# Patient Record
Sex: Male | Born: 1945 | Race: White | Hispanic: No | Marital: Married | Smoking: Former smoker
Health system: Southern US, Community
[De-identification: ages and names within clinical notes are randomized; demographics above are authoritative.]

## PROBLEM LIST (undated history)

## (undated) DIAGNOSIS — E785 Hyperlipidemia, unspecified: Secondary | ICD-10-CM

## (undated) DIAGNOSIS — M1990 Unspecified osteoarthritis, unspecified site: Secondary | ICD-10-CM

## (undated) DIAGNOSIS — N200 Calculus of kidney: Secondary | ICD-10-CM

## (undated) DIAGNOSIS — I2510 Atherosclerotic heart disease of native coronary artery without angina pectoris: Secondary | ICD-10-CM

## (undated) HISTORY — PX: OTHER SURGICAL HISTORY: SHX169

## (undated) HISTORY — PX: SHOULDER SURGERY: SHX246

## (undated) HISTORY — PX: BACK SURGERY: SHX140

## (undated) HISTORY — PX: FOOT SURGERY: SHX648

## (undated) HISTORY — PX: EYE SURGERY: SHX253

## (undated) HISTORY — DX: Calculus of kidney: N20.0

## (undated) HISTORY — DX: Hyperlipidemia, unspecified: E78.5

---

## 2003-03-16 ENCOUNTER — Encounter: Admission: RE | Admit: 2003-03-16 | Discharge: 2003-03-16 | Payer: Self-pay | Admitting: Orthopedic Surgery

## 2003-04-10 ENCOUNTER — Encounter: Admission: RE | Admit: 2003-04-10 | Discharge: 2003-04-10 | Payer: Self-pay | Admitting: Neurosurgery

## 2003-04-21 ENCOUNTER — Encounter: Admission: RE | Admit: 2003-04-21 | Discharge: 2003-04-21 | Payer: Self-pay | Admitting: Neurosurgery

## 2003-06-01 ENCOUNTER — Ambulatory Visit (HOSPITAL_COMMUNITY): Admission: RE | Admit: 2003-06-01 | Discharge: 2003-06-01 | Payer: Self-pay | Admitting: Neurosurgery

## 2003-08-07 ENCOUNTER — Encounter: Admission: RE | Admit: 2003-08-07 | Discharge: 2003-08-07 | Payer: Self-pay | Admitting: Orthopedic Surgery

## 2007-11-18 ENCOUNTER — Encounter: Admission: RE | Admit: 2007-11-18 | Discharge: 2007-11-18 | Payer: Self-pay | Admitting: Sports Medicine

## 2010-03-28 ENCOUNTER — Encounter: Payer: Self-pay | Admitting: Sports Medicine

## 2010-07-22 NOTE — Op Note (Signed)
NAME:  Robert Petty, Robert Petty                        ACCOUNT NO.:  [id number]   MEDICAL RECORD NO.:  [id number]                   PATIENT TYPE:  OIB   LOCATION:  2899                                 FACILITY:  [facility]   PHYSICIAN:  Reinaldo Meeker, M.D.              DATE OF BIRTH:  18-May-1945   DATE OF PROCEDURE:  06/01/2003  DATE OF DISCHARGE:                                 OPERATIVE REPORT   PREOPERATIVE DIAGNOSIS:  Herniated disk, L4-5 left, far lateral.   POSTOPERATIVE DIAGNOSIS:  Herniated disk, L4-5 left, far lateral.   OPERATION PERFORMED:  Left L4-5 extraforaminal diskectomy.   SECONDARY PROCEDURE:  Microdissection L4 nerve root and L4-5 disk.   SURGEON:  Reinaldo Meeker, M.D.   ASSISTANT:  Tia Alert, MD   ANESTHESIA:  General.   DESCRIPTION OF PROCEDURE:  After being placed in the prone position, the  patient's back was shaved, prepped and draped in the usual sterile fashion.  Localizing x-ray was taken prior to incision to identify the appropriate  level.  Midline incision was made over the spinous processes of L4 and L5.  Using Bovie cutting current, the incision was carried down to the spinous  processes.  Subperiosteal dissection was then carried out along the left  side of the spinous processes and lamina, facet joint into the far lateral  region where the transverse process of L4 was identified.  A second x-ray  showed approach to the appropriate level.  Using a high speed drill the  inferior edge of the transverse process, the lateral edge of the facet joint  were thinned and then the thinned edges removed with a Kerrison punch.  The  intertransverse ligament was incised and the cut edge removed with a  Kerrison punch.  Soft tissue in the far lateral region was separated and the  L4 nerve root was easily identified.  The microscope was draped, brought  into the field and used for the remainder of the case.  Using  microdissection technique the area inferior to  the L4 nerve root was  identified.  Disk herniation was noted in that area and the disk was  incised.  Numerous herniated disk fragments were removed from beneath the  nerve root and this gave excellent decompression of the L4 nerve root.  At  this point inspection was carried out in all directions for any evidence of  residual compression and none could be identified.  Large amounts of  irrigation were carried out.  Any bleeding was controlled with bipolar  coagulation and Gelfoam. The wound was then closed using __________ Vicryl  on the muscle and fascia and subcutaneous and subcuticular tissues and  staples on the skin.  A sterile dressing was then applied and the patient  was extubated and taken to the recovery room in stable condition.  Reinaldo Meeker, M.D.    ROK/MEDQ  D:  06/01/2003  T:  06/01/2003  Job:  [id number]

## 2010-11-16 ENCOUNTER — Ambulatory Visit
Admission: RE | Admit: 2010-11-16 | Discharge: 2010-11-16 | Disposition: A | Discharge status: Home / Self Care | Payer: PRIVATE HEALTH INSURANCE | Source: Ambulatory Visit | Attending: Podiatry | Admitting: Podiatry

## 2010-11-16 ENCOUNTER — Other Ambulatory Visit: Payer: Self-pay | Admitting: Podiatry

## 2010-11-16 DIAGNOSIS — M79672 Pain in left foot: Secondary | ICD-10-CM

## 2015-08-27 ENCOUNTER — Telehealth (HOSPITAL_COMMUNITY): Payer: Self-pay | Admitting: *Deleted

## 2015-08-27 NOTE — Telephone Encounter (Signed)
Left message for patient to call back to get nuclear stress test scheduled.

## 2015-08-30 ENCOUNTER — Other Ambulatory Visit (HOSPITAL_COMMUNITY): Payer: Self-pay | Admitting: Internal Medicine

## 2015-08-30 DIAGNOSIS — R079 Chest pain, unspecified: Secondary | ICD-10-CM

## 2015-09-09 ENCOUNTER — Telehealth (HOSPITAL_COMMUNITY): Payer: Self-pay | Admitting: *Deleted

## 2015-09-09 NOTE — Telephone Encounter (Signed)
Left message on voicemail in reference to upcoming appointment scheduled for 09/14/15. Phone number given for a call back so details instructions can be given. Rodolfo Gaster Jacqueline 

## 2015-09-13 ENCOUNTER — Telehealth (HOSPITAL_COMMUNITY): Payer: Self-pay

## 2015-09-13 NOTE — Telephone Encounter (Signed)
Patient given detailed instructions per Myocardial Perfusion Study Information Sheet for the test on 09/14/2015 at 7:15. Patient notified to arrive 15 minutes early and that it is imperative to arrive on time for appointment to keep from having the test rescheduled.  If you need to cancel or reschedule your appointment, please call the office within 24 hours of your appointment. Failure to do so may result in a cancellation of your appointment, and a $50 no show fee. Patient verbalized understanding.EHK

## 2015-09-14 ENCOUNTER — Ambulatory Visit (HOSPITAL_COMMUNITY): Payer: Medicare Other | Attending: Cardiology

## 2015-09-14 ENCOUNTER — Telehealth: Payer: Self-pay | Admitting: Cardiovascular Disease

## 2015-09-14 DIAGNOSIS — R9439 Abnormal result of other cardiovascular function study: Secondary | ICD-10-CM | POA: Diagnosis not present

## 2015-09-14 DIAGNOSIS — I517 Cardiomegaly: Secondary | ICD-10-CM | POA: Diagnosis not present

## 2015-09-14 DIAGNOSIS — R079 Chest pain, unspecified: Secondary | ICD-10-CM | POA: Insufficient documentation

## 2015-09-14 DIAGNOSIS — I452 Bifascicular block: Secondary | ICD-10-CM | POA: Insufficient documentation

## 2015-09-14 LAB — MYOCARDIAL PERFUSION IMAGING
CHL CUP NUCLEAR SDS: 3
CHL CUP NUCLEAR SRS: 1
CHL CUP RESTING HR STRESS: 51 {beats}/min
LHR: 0.18
LV dias vol: 169 mL (ref 62–150)
LV sys vol: 98 mL
Peak HR: 88 {beats}/min
SSS: 4
TID: 1.17

## 2015-09-14 MED ORDER — TECHNETIUM TC 99M TETROFOSMIN IV KIT
33.0000 | PACK | Freq: Once | INTRAVENOUS | Status: AC | PRN
  Administered 2015-09-14: 33 via INTRAVENOUS
  Filled 2015-09-14: qty 33

## 2015-09-14 MED ORDER — TECHNETIUM TC 99M TETROFOSMIN IV KIT
10.2000 | PACK | Freq: Once | INTRAVENOUS | Status: AC | PRN
  Administered 2015-09-14: 10 via INTRAVENOUS
  Filled 2015-09-14: qty 10

## 2015-09-14 MED ORDER — REGADENOSON 0.4 MG/5ML IV SOLN
0.4000 mg | Freq: Once | INTRAVENOUS | Status: AC
  Administered 2015-09-14: 0.4 mg via INTRAVENOUS

## 2015-09-14 NOTE — Telephone Encounter (Signed)
I would be happy to see him.  Will discuss with Marcelino DusterMichelle and see where where we can work him into the schedule

## 2015-09-14 NOTE — Telephone Encounter (Signed)
Will forward to Dr Nahser for review  

## 2015-09-14 NOTE — Telephone Encounter (Signed)
New message      Pt had stress test this am.  Dr Wylene Simmerisovec want to know if we are going to follow up with an ov or some other test based on the stress test results.  Please call

## 2015-09-15 ENCOUNTER — Ambulatory Visit (INDEPENDENT_AMBULATORY_CARE_PROVIDER_SITE_OTHER): Payer: Medicare Other | Admitting: Cardiovascular Disease

## 2015-09-15 ENCOUNTER — Encounter: Payer: Self-pay | Admitting: Cardiovascular Disease

## 2015-09-15 ENCOUNTER — Encounter: Payer: Self-pay | Admitting: Nurse Practitioner

## 2015-09-15 VITALS — BP 90/70 | HR 61 | Ht 70.5 in | Wt 232.8 lb

## 2015-09-15 DIAGNOSIS — R079 Chest pain, unspecified: Secondary | ICD-10-CM | POA: Diagnosis not present

## 2015-09-15 DIAGNOSIS — R9439 Abnormal result of other cardiovascular function study: Secondary | ICD-10-CM | POA: Diagnosis not present

## 2015-09-15 DIAGNOSIS — E785 Hyperlipidemia, unspecified: Secondary | ICD-10-CM | POA: Diagnosis not present

## 2015-09-15 LAB — CBC WITH DIFFERENTIAL/PLATELET
BASOS ABS: 0 {cells}/uL (ref 0–200)
BASOS PCT: 0 %
EOS PCT: 4 %
Eosinophils Absolute: 320 cells/uL (ref 15–500)
HCT: 42.6 % (ref 38.5–50.0)
HEMOGLOBIN: 14.4 g/dL (ref 13.2–17.1)
LYMPHS ABS: 1680 {cells}/uL (ref 850–3900)
Lymphocytes Relative: 21 %
MCH: 30.4 pg (ref 27.0–33.0)
MCHC: 33.8 g/dL (ref 32.0–36.0)
MCV: 89.9 fL (ref 80.0–100.0)
MONOS PCT: 9 %
MPV: 11.6 fL (ref 7.5–12.5)
Monocytes Absolute: 720 cells/uL (ref 200–950)
NEUTROS ABS: 5280 {cells}/uL (ref 1500–7800)
Neutrophils Relative %: 66 %
PLATELETS: 243 10*3/uL (ref 140–400)
RBC: 4.74 MIL/uL (ref 4.20–5.80)
RDW: 14 % (ref 11.0–15.0)
WBC: 8 10*3/uL (ref 3.8–10.8)

## 2015-09-15 MED ORDER — ATORVASTATIN CALCIUM 40 MG PO TABS: 40.0000 mg | ORAL_TABLET | Freq: Every day | ORAL | Status: DC

## 2015-09-15 MED ORDER — ASPIRIN EC 81 MG PO TBEC: 81.0000 mg | DELAYED_RELEASE_TABLET | Freq: Every day | ORAL | Status: DC

## 2015-09-15 NOTE — Telephone Encounter (Signed)
Left message for patient to call regarding setting up an appointment

## 2015-09-15 NOTE — Patient Instructions (Addendum)
Medication Instructions:  STOP Simvastatin START Atorvastatin 40 mg once daily  START Aspirin 81 mg once daily   Labwork: TODAY - Pt/INR, CBC, basic metabolic panel, cholesterol   Testing/Procedures: Your physician has requested that you have a cardiac catheterization. Cardiac catheterization is used to diagnose and/or treat various heart conditions. Doctors may recommend this procedure for a number of different reasons. The most common reason is to evaluate chest pain. Chest pain can be a symptom of coronary artery disease (CAD), and cardiac catheterization can show whether plaque is narrowing or blocking your heart's arteries. This procedure is also used to evaluate the valves, as well as measure the blood flow and oxygen levels in different parts of your heart. For further information please visit [email or web address] Please follow instruction sheet, as given.   Follow-Up: Your physician recommends that you return for a follow-up appointment on: September 14 @ 8:45 am   If you need a refill on your cardiac medications before your next appointment, please call your pharmacy.   Thank you for choosing [facility]! Eligha BridegroomMichelle Swinyer, RN [phone]

## 2015-09-15 NOTE — Progress Notes (Signed)
 Cardiology Office Note   Date:  09/15/2015   ID:  Robert Petty, DOB 07/04/1945, MRN [id number]  PCP:  Richard W Tisovec, MD  Cardiologist:   Philip Nahser, MD   Chief Complaint  Patient presents with  . Chest Pain   Problem list 1. Essential hypertension 2. Hyperlipidemia   History of Present Illness: Robert Petty is a 70 y.o. male who presents at the request of Dr. Tisovec.    He has had some "indigestion like CP " Has some shortness of breath with exertion . Used to work out at the gym Has been progressing over the past several months  No syncope Some dyspnea.   Symptoms better with rest  Not associated with eating or drinking  Or twisting torso   Has seen Dr. Tennant in the past.  Has had a cath in the past.  Had minimal irreg.   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      Past Medical History  Diagnosis Date  . Hyperlipidemia   . Kidney stones     Past Surgical History  Procedure Laterality Date  . Back surgery    . Herniated disc repair    . Shoulder surgery       Current Outpatient Prescriptions  Medication Sig Dispense Refill  . aspirin EC 81 MG tablet Take 1 tablet (81 mg total) by mouth daily.    . atorvastatin (LIPITOR) 40 MG tablet Take 1 tablet (40 mg total) by mouth daily. 30 tablet 11   No current facility-administered medications for this visit.    Allergies:   Review of patient's allergies indicates no known allergies.    Social History:  The patient  reports that he has quit smoking. He does not have any smokeless tobacco history on file. He reports that he drinks alcohol. He reports that he does not use illicit drugs.   Family History:  The patient's family history includes Alzheimer's disease in his mother; Brain cancer in his brother; Cancer in his paternal grandfather; Diabetes in his brother, brother, brother, brother,  sister, sister, and sister; Heart disease in his brother; Hyperlipidemia in his brother, brother, brother, brother, mother, sister, sister, and sister; Hypertension in his brother, mother, sister, sister, and sister; Leukemia in his sister; Lung cancer in his brother; Prostate cancer in his father.    ROS:  Please see the history of present illness.    Review of Systems: Constitutional:  denies fever, chills, diaphoresis, appetite change and fatigue.  HEENT: denies photophobia, eye pain, redness, hearing loss, ear pain, congestion, sore throat, rhinorrhea, sneezing, neck pain, neck stiffness and tinnitus.  Respiratory: denies SOB, DOE, cough, chest tightness, and wheezing.  Cardiovascular: admits to chest pain, palpitations and leg swelling.  Gastrointestinal: denies nausea, vomiting, abdominal pain, diarrhea, constipation, blood in stool.  Genitourinary: denies dysuria, urgency, frequency, hematuria, flank pain and difficulty urinating.  Musculoskeletal: denies  myalgias, back pain, joint swelling, arthralgias and gait problem.   Skin: denies pallor, rash and wound.  Neurological: denies dizziness, seizures, syncope, weakness, light-headedness, numbness and headaches.   Hematological: denies adenopathy, easy bruising, personal or family bleeding history.  Psychiatric/ Behavioral: denies suicidal ideation, mood changes, confusion, nervousness, sleep disturbance and agitation.       All other systems are reviewed and negative.    PHYSICAL EXAM: VS:  BP 90/70 mmHg  Pulse 61    Ht 5' 10.5" (1.791 m)  Wt 232 lb 12.8 oz (105.597 kg)  BMI 32.92 kg/m2 , BMI Body mass index is 32.92 kg/(m^2). GEN: Well nourished, well developed, in no acute distress HEENT: normal Neck: no JVD, carotid bruits, or masses Cardiac: \RRR; no murmurs, rubs, or gallops,no edema  Respiratory:  clear to auscultation bilaterally, normal work of breathing GI: soft, nontender, nondistended, + BS MS: no deformity or  atrophy Skin: warm and dry, no rash Neuro:  Strength and sensation are intact Psych: normal   EKG:  EKG is ordered today. The ekg ordered today demonstrates  NSR at 61.  LAHB.  Isolated TWI in III ( not significant )    Recent Labs: No results found for requested labs within last 365 days.    Lipid Panel No results found for: CHOL, TRIG, HDL, CHOLHDL, VLDL, LDLCALC, LDLDIRECT    Wt Readings from Last 3 Encounters:  09/15/15 232 lb 12.8 oz (105.597 kg)      Other studies Reviewed: Additional studies/ records that were reviewed today include: . Review of the above records demonstrates:    ASSESSMENT AND PLAN:  1.  Chest pain : Robert Petty presents today following an abnormal Myoview study. 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 His ejection fraction is mildly to moderately reduced with an EF of 42%.  He's had a cardiac cath in the past with Dr. Deborah Chalkennant. He was found have moderate irregularities at that time. He's been on simvastatin for many years.  We will schedule him for a cardiac cath on Wednesday . We'll start him on aspirin. We'll stop the simvastatin and put him on atorvastatin 40 mg a day. We discussed the risks, benefits, and options concerning heart cath.   He understands and agrees to proceed.  2. Hyperlipidemia:  We will discontinue the simvastatin and start him on atorvastatin 40 mg a day. He may need to be changed to crestor if he cannot reach an LDL of 70    Current medicines are reviewed at length with the patient today.  The patient does not have concerns regarding medicines.  Labs/ tests ordered today include:   Orders Placed This Encounter  Procedures  . INR/PT  . Basic Metabolic Panel (BMET)  . CBC w/Diff  . EKG 12-Lead     Disposition:   FU with me in 2-3 months      Kristeen MissPhilip Nahser, MD  09/15/2015 3:54 PM    [facility] [address], [place], [place]  [address] Phone: [phone]; Fax:  [phone]   [facility]  [address] [place], [place]  [address] [phone]   Fax [phone]

## 2015-09-15 NOTE — Telephone Encounter (Signed)
Spoke with patient who is scheduled to see Dr. Elease HashimotoNahser today at 3:15

## 2015-09-16 LAB — BASIC METABOLIC PANEL
BUN: 20 mg/dL (ref 7–25)
CALCIUM: 9.5 mg/dL (ref 8.6–10.3)
CHLORIDE: 108 mmol/L (ref 98–110)
CO2: 24 mmol/L (ref 20–31)
Creat: 0.98 mg/dL (ref 0.70–1.18)
Glucose, Bld: 82 mg/dL (ref 65–99)
Potassium: 4.3 mmol/L (ref 3.5–5.3)
SODIUM: 141 mmol/L (ref 135–146)

## 2015-09-16 LAB — LIPID PANEL
CHOL/HDL RATIO: 3.3 ratio (ref <=5.0)
CHOLESTEROL: 134 mg/dL (ref 125–200)
HDL: 41 mg/dL (ref >=40)
LDL Cholesterol: 71 mg/dL (ref <130)
TRIGLYCERIDES: 112 mg/dL (ref <150)
VLDL: 22 mg/dL (ref <30)

## 2015-09-16 LAB — PROTIME-INR
INR: 1
PROTHROMBIN TIME: 11 s (ref 9.0–11.5)

## 2015-09-22 ENCOUNTER — Encounter (HOSPITAL_COMMUNITY)
Admission: RE | Disposition: A | Discharge status: Home / Self Care | Payer: Self-pay | Source: Ambulatory Visit | Attending: Cardiovascular Disease

## 2015-09-22 ENCOUNTER — Ambulatory Visit (HOSPITAL_COMMUNITY)
Admission: RE | Admit: 2015-09-22 | Discharge: 2015-09-22 | Disposition: A | Discharge status: Home / Self Care | Payer: Medicare Other | Source: Ambulatory Visit | Attending: Cardiovascular Disease | Admitting: Cardiovascular Disease

## 2015-09-22 ENCOUNTER — Encounter (HOSPITAL_COMMUNITY): Payer: Self-pay | Admitting: Cardiovascular Disease

## 2015-09-22 DIAGNOSIS — I10 Essential (primary) hypertension: Secondary | ICD-10-CM | POA: Insufficient documentation

## 2015-09-22 DIAGNOSIS — E785 Hyperlipidemia, unspecified: Secondary | ICD-10-CM | POA: Diagnosis not present

## 2015-09-22 DIAGNOSIS — Z808 Family history of malignant neoplasm of other organs or systems: Secondary | ICD-10-CM | POA: Insufficient documentation

## 2015-09-22 DIAGNOSIS — Z87891 Personal history of nicotine dependence: Secondary | ICD-10-CM | POA: Diagnosis not present

## 2015-09-22 DIAGNOSIS — Z833 Family history of diabetes mellitus: Secondary | ICD-10-CM | POA: Diagnosis not present

## 2015-09-22 DIAGNOSIS — Z7982 Long term (current) use of aspirin: Secondary | ICD-10-CM | POA: Insufficient documentation

## 2015-09-22 DIAGNOSIS — Z8042 Family history of malignant neoplasm of prostate: Secondary | ICD-10-CM | POA: Diagnosis not present

## 2015-09-22 DIAGNOSIS — R9439 Abnormal result of other cardiovascular function study: Secondary | ICD-10-CM | POA: Diagnosis present

## 2015-09-22 DIAGNOSIS — Z801 Family history of malignant neoplasm of trachea, bronchus and lung: Secondary | ICD-10-CM | POA: Insufficient documentation

## 2015-09-22 DIAGNOSIS — Z87442 Personal history of urinary calculi: Secondary | ICD-10-CM | POA: Diagnosis not present

## 2015-09-22 DIAGNOSIS — I2510 Atherosclerotic heart disease of native coronary artery without angina pectoris: Secondary | ICD-10-CM | POA: Diagnosis not present

## 2015-09-22 DIAGNOSIS — Z8249 Family history of ischemic heart disease and other diseases of the circulatory system: Secondary | ICD-10-CM | POA: Diagnosis not present

## 2015-09-22 HISTORY — PX: CARDIAC CATHETERIZATION: SHX172

## 2015-09-22 SURGERY — LEFT HEART CATH AND CORONARY ANGIOGRAPHY
Anesthesia: LOCAL

## 2015-09-22 MED ORDER — SODIUM CHLORIDE 0.9% FLUSH: 3.0000 mL | INTRAVENOUS | Status: DC | PRN

## 2015-09-22 MED ORDER — HEPARIN (PORCINE) IN NACL 2-0.9 UNIT/ML-% IJ SOLN
INTRAMUSCULAR | Status: AC
  Filled 2015-09-22: qty 1000

## 2015-09-22 MED ORDER — ACETAMINOPHEN 325 MG PO TABS: 650.0000 mg | ORAL_TABLET | ORAL | Status: DC | PRN

## 2015-09-22 MED ORDER — IOPAMIDOL (ISOVUE-370) INJECTION 76%
INTRAVENOUS | Status: AC
  Filled 2015-09-22: qty 100

## 2015-09-22 MED ORDER — IOPAMIDOL (ISOVUE-370) INJECTION 76%
INTRAVENOUS | Status: DC | PRN
  Administered 2015-09-22: 85 mL

## 2015-09-22 MED ORDER — FENTANYL CITRATE (PF) 100 MCG/2ML IJ SOLN
INTRAMUSCULAR | Status: AC
  Filled 2015-09-22: qty 2

## 2015-09-22 MED ORDER — SODIUM CHLORIDE 0.9 % IV SOLN: 250.0000 mL | INTRAVENOUS | Status: DC | PRN

## 2015-09-22 MED ORDER — MIDAZOLAM HCL 2 MG/2ML IJ SOLN
INTRAMUSCULAR | Status: DC | PRN
  Administered 2015-09-22: 2 mg via INTRAVENOUS

## 2015-09-22 MED ORDER — VERAPAMIL HCL 2.5 MG/ML IV SOLN
INTRAVENOUS | Status: AC
  Filled 2015-09-22: qty 2

## 2015-09-22 MED ORDER — SODIUM CHLORIDE 0.9 % WEIGHT BASED INFUSION
3.0000 mL/kg/h | INTRAVENOUS | Status: AC
  Administered 2015-09-22: 3 mL/kg/h via INTRAVENOUS

## 2015-09-22 MED ORDER — ASPIRIN 81 MG PO CHEW
CHEWABLE_TABLET | ORAL | Status: AC
Start: 2015-09-22 — End: 2015-09-22
  Administered 2015-09-22: 81 mg via ORAL
  Filled 2015-09-22: qty 1

## 2015-09-22 MED ORDER — SODIUM CHLORIDE 0.9 % IV SOLN
250.0000 mL | INTRAVENOUS | Status: DC | PRN
Start: 2015-09-22 — End: 2015-09-22

## 2015-09-22 MED ORDER — SODIUM CHLORIDE 0.9% FLUSH: 3.0000 mL | Freq: Two times a day (BID) | INTRAVENOUS | Status: DC

## 2015-09-22 MED ORDER — SODIUM CHLORIDE 0.9 % IV SOLN
INTRAVENOUS | Status: DC
Start: 2015-09-22 — End: 2015-09-22

## 2015-09-22 MED ORDER — VERAPAMIL HCL 2.5 MG/ML IV SOLN
INTRAVENOUS | Status: DC | PRN
  Administered 2015-09-22: 10 mL via INTRA_ARTERIAL

## 2015-09-22 MED ORDER — HEPARIN SODIUM (PORCINE) 1000 UNIT/ML IJ SOLN
INTRAMUSCULAR | Status: DC | PRN
  Administered 2015-09-22: 5000 [IU] via INTRAVENOUS

## 2015-09-22 MED ORDER — MIDAZOLAM HCL 2 MG/2ML IJ SOLN
INTRAMUSCULAR | Status: AC
  Filled 2015-09-22: qty 2

## 2015-09-22 MED ORDER — HEPARIN SODIUM (PORCINE) 1000 UNIT/ML IJ SOLN
INTRAMUSCULAR | Status: AC
  Filled 2015-09-22: qty 1

## 2015-09-22 MED ORDER — SODIUM CHLORIDE 0.9 % WEIGHT BASED INFUSION: 1.0000 mL/kg/h | INTRAVENOUS | Status: DC

## 2015-09-22 MED ORDER — HEPARIN (PORCINE) IN NACL 2-0.9 UNIT/ML-% IJ SOLN
INTRAMUSCULAR | Status: DC | PRN
  Administered 2015-09-22: 09:00:00

## 2015-09-22 MED ORDER — LIDOCAINE HCL (PF) 1 % IJ SOLN
INTRAMUSCULAR | Status: DC | PRN
  Administered 2015-09-22: 2 mL

## 2015-09-22 MED ORDER — FENTANYL CITRATE (PF) 100 MCG/2ML IJ SOLN
INTRAMUSCULAR | Status: DC | PRN
  Administered 2015-09-22: 25 ug via INTRAVENOUS

## 2015-09-22 MED ORDER — ASPIRIN 81 MG PO CHEW
81.0000 mg | CHEWABLE_TABLET | ORAL | Status: AC
  Administered 2015-09-22: 81 mg via ORAL

## 2015-09-22 MED ORDER — LIDOCAINE HCL (PF) 1 % IJ SOLN
INTRAMUSCULAR | Status: AC
  Filled 2015-09-22: qty 30

## 2015-09-22 MED ORDER — ONDANSETRON HCL 4 MG/2ML IJ SOLN: 4.0000 mg | Freq: Four times a day (QID) | INTRAMUSCULAR | Status: DC | PRN

## 2015-09-22 SURGICAL SUPPLY — 11 items

## 2015-09-22 NOTE — H&P (View-Only) (Signed)
Cardiology Office Note   Date:  09/15/2015   ID:  Robert Petty, DOB 1946/03/02, MRN [id number]  PCP:  Gaspar Garbe, MD  Cardiologist:   Kristeen Miss, MD   Chief Complaint  Patient presents with  . Chest Pain   Problem list 1. Essential hypertension 2. Hyperlipidemia   History of Present Illness: Robert Petty is a 70 y.o. male who presents at the request of Dr. Wylene Simmer.    He has had some "indigestion like CP " Has some shortness of breath with exertion . Used to work out at [employer] Has been progressing over the past several months  No syncope Some dyspnea.   Symptoms better with rest  Not associated with eating or drinking  Or twisting torso   Has seen Dr. Deborah Chalk in the past.  Has had a cath in the past.  Had minimal irreg.   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      Past Medical History  Diagnosis Date  . Hyperlipidemia   . Kidney stones     Past Surgical History  Procedure Laterality Date  . Back surgery    . Herniated disc repair    . Shoulder surgery       Current Outpatient Prescriptions  Medication Sig Dispense Refill  . aspirin EC 81 MG tablet Take 1 tablet (81 mg total) by mouth daily.    Marland Kitchen atorvastatin (LIPITOR) 40 MG tablet Take 1 tablet (40 mg total) by mouth daily. 30 tablet 11   No current facility-administered medications for this visit.    Allergies:   Review of patient's allergies indicates no known allergies.    Social History:  The patient  reports that he has quit smoking. He does not have any smokeless tobacco history on file. He reports that he drinks alcohol. He reports that he does not use illicit drugs.   Family History:  The patient's family history includes Alzheimer's disease in his mother; Brain cancer in his brother; Cancer in his paternal grandfather; Diabetes in his brother, brother, brother, brother,  sister, sister, and sister; Heart disease in his brother; Hyperlipidemia in his brother, brother, brother, brother, mother, sister, sister, and sister; Hypertension in his brother, mother, sister, sister, and sister; Leukemia in his sister; Lung cancer in his brother; Prostate cancer in his father.    ROS:  Please see the history of present illness.    Review of Systems: Constitutional:  denies fever, chills, diaphoresis, appetite change and fatigue.  HEENT: denies photophobia, eye pain, redness, hearing loss, ear pain, congestion, sore throat, rhinorrhea, sneezing, neck pain, neck stiffness and tinnitus.  Respiratory: denies SOB, DOE, cough, chest tightness, and wheezing.  Cardiovascular: admits to chest pain, palpitations and leg swelling.  Gastrointestinal: denies nausea, vomiting, abdominal pain, diarrhea, constipation, blood in stool.  Genitourinary: denies dysuria, urgency, frequency, hematuria, flank pain and difficulty urinating.  Musculoskeletal: denies  myalgias, back pain, joint swelling, arthralgias and gait problem.   Skin: denies pallor, rash and wound.  Neurological: denies dizziness, seizures, syncope, weakness, light-headedness, numbness and headaches.   Hematological: denies adenopathy, easy bruising, personal or family bleeding history.  Psychiatric/ Behavioral: denies suicidal ideation, mood changes, confusion, nervousness, sleep disturbance and agitation.       All other systems are reviewed and negative.    PHYSICAL EXAM: VS:  BP 90/70 mmHg  Pulse 61  Ht 5' 10.5" (1.791 m)  Wt 232 lb 12.8 oz (105.597 kg)  BMI 32.92 kg/m2 , BMI Body mass index is 32.92 kg/(m^2). GEN: Well nourished, well developed, in no acute distress HEENT: normal Neck: no JVD, carotid bruits, or masses Cardiac: \RRR; no murmurs, rubs, or gallops,no edema  Respiratory:  clear to auscultation bilaterally, normal work of breathing GI: soft, nontender, nondistended, + BS MS: no deformity or  atrophy Skin: warm and dry, no rash Neuro:  Strength and sensation are intact Psych: normal   EKG:  EKG is ordered today. The ekg ordered today demonstrates  NSR at 61.  LAHB.  Isolated TWI in III ( not significant )    Recent Labs: No results found for requested labs within last 365 days.    Lipid Panel No results found for: CHOL, TRIG, HDL, CHOLHDL, VLDL, LDLCALC, LDLDIRECT    Wt Readings from Last 3 Encounters:  09/15/15 232 lb 12.8 oz (105.597 kg)      Other studies Reviewed: Additional studies/ records that were reviewed today include: . Review of the above records demonstrates:    ASSESSMENT AND PLAN:  1.  Chest pain : Robert Petty presents today following an abnormal Myoview study. 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 His ejection fraction is mildly to moderately reduced with an EF of 42%.  He's had a cardiac cath in the past with Dr. Deborah Chalkennant. He was found have moderate irregularities at that time. He's been on simvastatin for many years.  We will schedule him for a cardiac cath on Wednesday . We'll start him on aspirin. We'll stop the simvastatin and put him on atorvastatin 40 mg a day. We discussed the risks, benefits, and options concerning heart cath.   He understands and agrees to proceed.  2. Hyperlipidemia:  We will discontinue the simvastatin and start him on atorvastatin 40 mg a day. He may need to be changed to crestor if he cannot reach an LDL of 70    Current medicines are reviewed at length with the patient today.  The patient does not have concerns regarding medicines.  Labs/ tests ordered today include:   Orders Placed This Encounter  Procedures  . INR/PT  . Basic Metabolic Panel (BMET)  . CBC w/Diff  . EKG 12-Lead     Disposition:   FU with me in 2-3 months      Kristeen MissPhilip Nahser, MD  09/15/2015 3:54 PM    [facility] [address], [place], [place]  [address] Phone: [phone]; Fax:  [phone]   [facility]  [address] [place], [place]  [address] [phone]   Fax [phone]

## 2015-09-22 NOTE — Interval H&P Note (Signed)
Cath Lab Visit (complete for each Cath Lab visit)  Clinical Evaluation Leading to the Procedure:   ACS: No.  Non-ACS:    Anginal Classification: CCS II  Anti-ischemic medical therapy: No Therapy  Non-Invasive Test Results: Intermediate-risk stress test findings: cardiac mortality 1-3%/year  Prior CABG: No previous CABG  History and Physical Interval Note:  09/22/2015 8:49 AM  Carollee Siresonald L Lave  has presented today for surgery, with the diagnosis of abnormal myoview, cp  The various methods of treatment have been discussed with the patient and family. After consideration of risks, benefits and other options for treatment, the patient has consented to  Procedure(s): Left Heart Cath and Coronary Angiography (N/A) as a surgical intervention .  The patient's history has been reviewed, patient examined, no change in status, stable for surgery.  I have reviewed the patient's chart and labs.  Questions were answered to the patient's satisfaction.     Tonny Bollmanooper, Oaklynn Stierwalt

## 2015-09-22 NOTE — Discharge Instructions (Signed)
Radial Site Care °Refer to this sheet in the next few weeks. These instructions provide you with information about caring for yourself after your procedure. Your health care provider may also give you more specific instructions. Your treatment has been planned according to current medical practices, but problems sometimes occur. Call your health care provider if you have any problems or questions after your procedure. °WHAT TO EXPECT AFTER THE PROCEDURE °After your procedure, it is typical to have the following: °· Bruising at the radial site that usually fades within 1-2 weeks. °· Blood collecting in the tissue (hematoma) that may be painful to the touch. It should usually decrease in size and tenderness within 1-2 weeks. °HOME CARE INSTRUCTIONS °· Take medicines only as directed by your health care provider. °· You may shower 24-48 hours after the procedure or as directed by your health care provider. Remove the bandage (dressing) and gently wash the site with plain soap and water. Pat the area dry with a clean towel. Do not rub the site, because this may cause bleeding. °· Do not take baths, swim, or use a hot tub until your health care provider approves. °· Check your insertion site every day for redness, swelling, or drainage. °· Do not apply powder or lotion to the site. °· Do not flex or bend the affected arm for 24 hours or as directed by your health care provider. °· Do not push or pull heavy objects with the affected arm for 24 hours or as directed by your health care provider. °· Do not lift over 10 lb (4.5 kg) for 5 days after your procedure or as directed by your health care provider. °· Ask your health care provider when it is okay to: °¨ Return to work or school. °¨ Resume usual physical activities or sports. °¨ Resume sexual activity. °· Do not drive home if you are discharged the same day as the procedure. Have someone else drive you. °· You may drive 24 hours after the procedure unless otherwise  instructed by your health care provider. °· Do not operate machinery or power tools for 24 hours after the procedure. °· If your procedure was done as an outpatient procedure, which means that you went home the same day as your procedure, a responsible adult should be with you for the first 24 hours after you arrive home. °· Keep all follow-up visits as directed by your health care provider. This is important. °SEEK MEDICAL CARE IF: °· You have a fever. °· You have chills. °· You have increased bleeding from the radial site. Hold pressure on the site. CALL 911 °SEEK IMMEDIATE MEDICAL CARE IF: °· You have unusual pain at the radial site. °· You have redness, warmth, or swelling at the radial site. °· You have drainage (other than a small amount of blood on the dressing) from the radial site. °· The radial site is bleeding, and the bleeding does not stop after 30 minutes of holding steady pressure on the site. °· Your arm or hand becomes pale, cool, tingly, or numb. °  °This information is not intended to replace advice given to you by your health care provider. Make sure you discuss any questions you have with your health care provider. °  °Document Released: 03/25/2010 Document Revised: 03/13/2014 Document Reviewed: 09/08/2013 °Elsevier Interactive Patient Education ©2016 [employer] ° °

## 2015-09-27 ENCOUNTER — Other Ambulatory Visit: Payer: Self-pay

## 2015-09-27 MED ORDER — ATORVASTATIN CALCIUM 40 MG PO TABS: 40.0000 mg | ORAL_TABLET | Freq: Every day | ORAL | 3 refills | Status: AC

## 2015-10-21 ENCOUNTER — Encounter: Payer: Self-pay | Admitting: Cardiovascular Disease

## 2015-11-18 ENCOUNTER — Ambulatory Visit: Payer: Medicare Other | Admitting: Cardiovascular Disease

## 2015-12-07 ENCOUNTER — Encounter: Payer: Self-pay | Admitting: Cardiovascular Disease

## 2015-12-21 ENCOUNTER — Ambulatory Visit (INDEPENDENT_AMBULATORY_CARE_PROVIDER_SITE_OTHER): Payer: Medicare Other | Admitting: Cardiovascular Disease

## 2015-12-21 ENCOUNTER — Encounter: Payer: Self-pay | Admitting: Cardiovascular Disease

## 2015-12-21 VITALS — BP 130/60 | HR 60 | Ht 71.0 in | Wt 237.4 lb

## 2015-12-21 DIAGNOSIS — R9439 Abnormal result of other cardiovascular function study: Secondary | ICD-10-CM | POA: Diagnosis not present

## 2015-12-21 DIAGNOSIS — E782 Mixed hyperlipidemia: Secondary | ICD-10-CM | POA: Diagnosis not present

## 2015-12-21 NOTE — Progress Notes (Signed)
Cardiology Office Note   Date:  12/21/2015   ID:  Robert Petty, DOB October 26, 1945, MRN [id number]  PCP:  Gaspar Garbe, MD  Cardiologist:   Kristeen Miss, MD   Chief Complaint  Patient presents with  . Follow-up    abn. stress test   Problem list 1. Essential hypertension 2. Hyperlipidemia   History of Present Illness: Robert Petty is a 70 y.o. male who presents at the request of Dr. Wylene Simmer.    He has had some "indigestion like CP " Has some shortness of breath with exertion . Used to work out at [employer] Has been progressing over the past several months  No syncope Some dyspnea.   Symptoms better with rest  Not associated with eating or drinking  Or twisting torso   Has seen Dr. Deborah Chalk in the past.  Has had a cath in the past.  Had minimal irreg.   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  Oct. 17, 2017:  [name] had an abnormal Celine Ahr And had a cath on September 22, 2015. Cath revealed  Conclusion    The left ventricular systolic function is normal.  Mid RCA lesion, 40% stenosed.  Ost 1st Mrg to 1st Mrg lesion, 50% stenosed.  Prox LAD lesion, 25% stenosed.   1. Mild diffuse nonobstructive coronary artery disease 2. Low normal LV systolic function with normal LVEDP   No real angina He has some dyspnea with severe exertion Was out at [facility] park - [place],000 feet in elevation. Had some shortness of breath  Has lost 13 lbs this year     Past Medical History:  Diagnosis Date  . Hyperlipidemia   . Kidney stones     Past Surgical History:  Procedure Laterality Date  . BACK SURGERY    . CARDIAC CATHETERIZATION N/A 09/22/2015   Procedure: Left Heart Cath and Coronary Angiography;  Surgeon: Tonny Bollman, MD;  Location: [facility] INVASIVE CV LAB;  Service: Cardiovascular;  Laterality: N/A;  . HERNIATED DISC REPAIR    . SHOULDER SURGERY       Current  Outpatient Prescriptions  Medication Sig Dispense Refill  . aspirin EC 81 MG tablet Take 1 tablet (81 mg total) by mouth daily.    Marland Kitchen atorvastatin (LIPITOR) 40 MG tablet Take 1 tablet (40 mg total) by mouth daily. 90 tablet 3  . Multiple Vitamin (MULTIVITAMIN WITH MINERALS) TABS tablet Take 1 tablet by mouth daily.     No current facility-administered medications for this visit.     Allergies:   Review of patient's allergies indicates no known allergies.    Social History:  The patient  reports that he has quit smoking. He has never used smokeless tobacco. He reports that he drinks alcohol. He reports that he does not use drugs.   Family History:  The patient's family history includes Alzheimer's disease in his mother; Brain cancer in his brother; Cancer in his paternal grandfather; Diabetes in his brother, brother, brother, brother, sister, sister, and sister; Heart disease in his brother; Hyperlipidemia in his brother, brother, brother, brother, mother, sister, sister, and sister; Hypertension in his brother, mother, sister, sister, and sister; Leukemia in his sister; Lung cancer in his brother; Prostate cancer in his father.    ROS:  Please see the history of present illness.    Review of Systems: Constitutional:  denies fever, chills,  diaphoresis, appetite change and fatigue.  HEENT: denies photophobia, eye pain, redness, hearing loss, ear pain, congestion, sore throat, rhinorrhea, sneezing, neck pain, neck stiffness and tinnitus.  Respiratory: denies SOB, DOE, cough, chest tightness, and wheezing.  Cardiovascular: admits to chest pain, palpitations and leg swelling.  Gastrointestinal: denies nausea, vomiting, abdominal pain, diarrhea, constipation, blood in stool.  Genitourinary: denies dysuria, urgency, frequency, hematuria, flank pain and difficulty urinating.  Musculoskeletal: denies  myalgias, back pain, joint swelling, arthralgias and gait problem.   Skin: denies pallor, rash and  wound.  Neurological: denies dizziness, seizures, syncope, weakness, light-headedness, numbness and headaches.   Hematological: denies adenopathy, easy bruising, personal or family bleeding history.  Psychiatric/ Behavioral: denies suicidal ideation, mood changes, confusion, nervousness, sleep disturbance and agitation.       All other systems are reviewed and negative.    PHYSICAL EXAM: VS:  BP 130/60   Pulse 60   Ht 5\' 11"  (1.803 m)   Wt 237 lb 6.4 oz (107.7 kg)   SpO2 97%   BMI 33.11 kg/m  , BMI Body mass index is 33.11 kg/m. GEN: Well nourished, well developed, in no acute distress  HEENT: normal  Neck: no JVD, carotid bruits, or masses Cardiac: \RRR; no murmurs, rubs, or gallops,no edema  Respiratory:  clear to auscultation bilaterally, normal work of breathing GI: soft, nontender, nondistended, + BS MS: no deformity or atrophy  Skin: warm and dry, no rash Neuro:  Strength and sensation are intact Psych: normal   EKG:  EKG is ordered today. The ekg ordered today demonstrates  NSR at 61.  LAHB.  Isolated TWI in III ( not significant )    Recent Labs: 09/15/2015: BUN 20; Creat 0.98; Hemoglobin 14.4; Platelets 243; Potassium 4.3; Sodium 141    Lipid Panel    Component Value Date/Time   CHOL 134 09/15/2015 1606   TRIG 112 09/15/2015 1606   HDL 41 09/15/2015 1606   CHOLHDL 3.3 09/15/2015 1606   VLDL 22 09/15/2015 1606   LDLCALC 71 09/15/2015 1606      Wt Readings from Last 3 Encounters:  12/21/15 237 lb 6.4 oz (107.7 kg)  09/22/15 230 lb (104.3 kg)  09/15/15 232 lb 12.8 oz (105.6 kg)      Other studies Reviewed: Additional studies/ records that were reviewed today include: . Review of the above records demonstrates:    ASSESSMENT AND PLAN:  1.  Chest pain : [name] presents today following an abnormal Myoview study. Cath showed mild irreg. Lipids are well controlled  I will see him on an as needed basis. He will follow up with dr. Wylene Simmerisovec.  2.  Hyperlipidemia:  His lipids are well controlled. Continue atorvastatin Further management per Dr. Wylene Simmerisovec.

## 2015-12-21 NOTE — Patient Instructions (Signed)
Your physician recommends that you continue on your current medications as directed. Please refer to the Current Medication list given to you today.  Your physician recommends that you schedule a follow-up appointment as needed with Dr. Nahser.  

## 2016-08-07 ENCOUNTER — Other Ambulatory Visit: Payer: Self-pay | Admitting: Cardiovascular Disease

## 2016-08-08 NOTE — Telephone Encounter (Signed)
Should this be deferred to Dr Wylene Simmerisovec? Per last office visit patient is to follow up with Dr Elease HashimotoNahser PRN and further management of hyperlipidemia per Dr Wylene Simmerisovec. Please advise. Thanks, MI

## 2016-08-08 NOTE — Telephone Encounter (Signed)
Please defer to PCP

## 2017-11-09 ENCOUNTER — Telehealth: Payer: Self-pay

## 2017-11-09 NOTE — Telephone Encounter (Signed)
   Primary Cardiologist:Philip Nahser, MD (Last seen 12/2015)  Chart reviewed as part of pre-operative protocol coverage. Because of Motty Spano Vandenberghe's past medical history and time since last visit, he/she will require a follow-up visit in order to better assess preoperative cardiovascular risk.  Pre-op covering staff: - Please schedule appointment and call patient to inform them. - Please contact requesting surgeon's office via preferred method (i.e, phone, fax) to inform them of need for appointment prior to surgery.  [place], [place]  11/09/2017, 1:01 PM

## 2017-11-09 NOTE — Telephone Encounter (Signed)
   [facility] Pre-operative Risk Assessment    Request for surgical clearance:  1. What type of surgery is being performed? Left knee replacement    2. When is this surgery scheduled? 12/03/17   3. What type of clearance is required (medical clearance vs. Pharmacy clearance to hold med vs. Both)? Both  4. Are there any medications that need to be held prior to surgery and how long?  Aspirin   5. Practice name and name of physician performing surgery? [facility], Dr. Frederik Pear   6. What is your office phone number: [phone]    7.   What is your office fax number: [phone]  8.   Anesthesia type (None, local, MAC, general)  Spinal   Sarina Ill 11/09/2017, 11:43 AM  _________________________________________________________________   (provider comments below)

## 2017-11-09 NOTE — Telephone Encounter (Signed)
Informed pt of appointment with Tereso Newcomer, PA-C on 9/11 @ 8:15 for clearance. Pt verbalized understanding and thanked me for the call.  Will forward pre-op recommendations to surgeon's office via Epic and Manual fax.

## 2017-11-13 NOTE — Progress Notes (Signed)
Cardiology Office Note:    Date:  11/14/2017   ID:  Robert Petty, DOB [birth year], MRN [id number]  PCP:  Robert Garbe, MD  Cardiologist:  Robert Miss, MD  Electrophysiologist:  None   Referring MD: Robert Garbe, MD   Chief Complaint  Patient presents with  . Surgical Clearance    History of Present Illness:    Robert Petty is a 72 y.o. male with mild non-obstructive coronary artery disease by Cardiac Catheterization in 2017.  He had an abnormal nuclear study prior to this.  Last seen by Dr. Elease Petty in 12/2015.     Robert Petty returns for surgical clearance.  He needs a knee replacement with Robert Petty.  He is here alone.  He has not had any chest pain, shortness of breath, syncope, leg swelling or paroxysmal nocturnal dyspnea.  He remains active despite his degenerative joint disease.  He goes to the gym every day and rides the stationary bike and uses the elliptical machine.  He has no cardiovascular limitations.    Prior CV studies:   The following studies were reviewed today:  Cardiac Catheterization 09/22/15 LM normal LAD prox 20-30 LCx ok; OM1 ostial 50 RCA mid 40 EF 55  Nuclear stress test 09/14/15 EF 42, inf scar; Intermediate risk study  Past Medical History:  Diagnosis Date  . Hyperlipidemia   . Kidney stones    Surgical Hx: The patient  has a past surgical history that includes Back surgery; HERNIATED DISC REPAIR; Shoulder surgery; and Cardiac catheterization (N/A, 09/22/2015).   Current Medications: Current Meds  Medication Sig  . aspirin EC 81 MG tablet Take 1 tablet (81 mg total) by mouth daily.  Marland Kitchen atorvastatin (LIPITOR) 40 MG tablet Take 1 tablet (40 mg total) by mouth daily.  . Multiple Vitamin (MULTIVITAMIN WITH MINERALS) TABS tablet Take 1 tablet by mouth daily.     Allergies:   Patient has no known allergies.   Social History   Tobacco Use  . Smoking status: Former Games developer  . Smokeless tobacco: Never Used  Substance Use Topics   . Alcohol use: Yes    Comment: OCCASIONALLY  . Drug use: No     Family Hx: The patient's family history includes Alzheimer's disease in his mother; Brain cancer in his brother; Cancer in his paternal grandfather; Diabetes in his brother, brother, brother, brother, sister, sister, and sister; Heart disease in his brother; Hyperlipidemia in his brother, brother, brother, brother, mother, sister, sister, and sister; Hypertension in his brother, mother, sister, sister, and sister; Leukemia in his sister; Lung cancer in his brother; Prostate cancer in his father.  ROS:   Please see the history of present illness.    Review of Systems  Hematologic/Lymphatic: Bruises/bleeds easily.  Musculoskeletal: Positive for joint pain.  Psychiatric/Behavioral: Positive for depression. The patient is nervous/anxious.    All other systems reviewed and are negative.   EKGs/Labs/Other Test Reviewed:    EKG:  EKG is  ordered today.  The ekg ordered today demonstrates sinus bradycardia, HR 56, low voltage limb leads, ? Lead reversal, QTc 372, non-specific ST-TW changes   Recent Labs: No results found for requested labs within last 8760 hours.   Recent Lipid Panel Lab Results  Component Value Date/Time   CHOL 134 09/15/2015 04:06 PM   TRIG 112 09/15/2015 04:06 PM   HDL 41 09/15/2015 04:06 PM   CHOLHDL 3.3 09/15/2015 04:06 PM   LDLCALC 71 09/15/2015 04:06 PM   Cholesterol, total 142.000 m  08/21/2017 HDL 39 MG/DL 1/61/0960 LDL 45.409 mg 08/21/2017 Triglycerides 85.000 08/21/2017 A1C 6.200 % 08/21/2017 Hemoglobin 14.800 g/ 08/21/2017 Creatinine, Serum 0.900 mg/ 08/21/2017 Potassium 4.300 09/15/2015 Magnesium N/D ALT (SGPT) 19.000 uni 08/21/2017 TSH N/D BNP N/D INR 1.000 09/15/2015 Platelets 243.000 09/15/2015  Physical Exam:    VS:  BP 136/72   Pulse 75   Ht 5\' 11"  (1.803 m)   Wt 241 lb 12 oz (109.7 kg)   SpO2 95%   BMI 33.72 kg/m     Wt Readings from Last 3 Encounters:  11/14/17 241 lb 12 oz  (109.7 kg)  12/21/15 237 lb 6.4 oz (107.7 kg)  09/22/15 230 lb (104.3 kg)     Physical Exam  Constitutional: He is oriented to person, place, and time. He appears well-developed and well-nourished. No distress.  HENT:  Head: Normocephalic and atraumatic.  Eyes: No scleral icterus.  Neck: No JVD present. Carotid bruit is not present. No thyromegaly present.  Cardiovascular: Normal rate and regular rhythm.  No murmur heard. Pulmonary/Chest: Effort normal. He has no rales.  Abdominal: Soft. He exhibits no distension.  Musculoskeletal: He exhibits no edema.  Lymphadenopathy:    He has no cervical adenopathy.  Neurological: He is alert and oriented to person, place, and time.  Skin: Skin is warm and dry.  Psychiatric: He has a normal mood and affect.    ASSESSMENT & PLAN:    Preoperative cardiovascular examination The patient is doing well from a cardiac standpoint.  He has mild non-obstructive coronary artery disease by prior cardiac catheterization.  He is not currently having any unstable cardiac conditions.  He remains active without limitations.  The Revised Cardiac Risk Index indicates that his Perioperative Risk of Major Cardiac Event is (%): 0.4.  Therefore, he is at low risk for perioperative complications.  His Functional Capacity in METs is good at 5.6 as indicated by the Duke Activity Status Index (DASI).  According to ACC/AHA Guidelines, no further testing is needed.  He may proceed with surgery at acceptable risk.  If needed, aspirin can be held for 7 days prior to his surgery.  It can be resumed post operatively when it is felt to be safe from a bleeding standpoint.    Coronary artery disease involving native coronary artery of native heart without angina pectoris Mild non-obstructive disease by prior Cardiac Catheterization.  He denies angina.  Continue ASA, statin.  He can follow up with Cardiology as needed.  Mixed hyperlipidemia Managed by primary care.  Continue statin.     Dispo:  Return as needed with Dr. Elease Petty.   Medication Adjustments/Labs and Tests Ordered: Current medicines are reviewed at length with the patient today.  Concerns regarding medicines are outlined above.  Tests Ordered: Orders Placed This Encounter  Procedures  . EKG 12-Lead   Medication Changes: No orders of the defined types were placed in this encounter.   Signed, Tereso Newcomer, PA-C  11/14/2017 9:01 AM    [facility] [address] [place], [place], [place]  [address] Phone: [phone]; Fax: [phone]

## 2017-11-14 ENCOUNTER — Encounter: Payer: Self-pay | Admitting: Physician Assistant

## 2017-11-14 ENCOUNTER — Ambulatory Visit: Payer: Medicare Other | Admitting: Physician Assistant

## 2017-11-14 VITALS — BP 136/72 | HR 75 | Ht 71.0 in | Wt 241.8 lb

## 2017-11-14 DIAGNOSIS — Z01810 Encounter for preprocedural cardiovascular examination: Secondary | ICD-10-CM

## 2017-11-14 DIAGNOSIS — I2510 Atherosclerotic heart disease of native coronary artery without angina pectoris: Secondary | ICD-10-CM

## 2017-11-14 DIAGNOSIS — E782 Mixed hyperlipidemia: Secondary | ICD-10-CM

## 2017-11-14 NOTE — Telephone Encounter (Signed)
Note faxed to Dr. Wadie Lessen office from visit 11/14/2017. Tereso Newcomer, PA-C    11/14/2017 9:05 AM

## 2017-11-14 NOTE — Patient Instructions (Signed)
Medication Instructions:  Your physician recommends that you continue on your current medications as directed. Please refer to the Current Medication list given to you today.   Labwork: NONE ORDERED TODAY  Testing/Procedures: NONE ORDERED TODAY  Follow-Up: FOLLOW UP AS NEEDED  Any Other Special Instructions Will Be Listed Below (If Applicable).     If you need a refill on your cardiac medications before your next appointment, please call your pharmacy.    

## 2017-11-15 ENCOUNTER — Other Ambulatory Visit: Payer: Self-pay | Admitting: Orthopedic Surgery

## 2017-11-21 ENCOUNTER — Other Ambulatory Visit: Payer: Self-pay | Admitting: Orthopedic Surgery

## 2017-11-28 NOTE — Patient Instructions (Addendum)
Robert Petty  11/28/2017   Your procedure is scheduled on: 12-03-17   Report to [facility] Main  Entrance    Report to admitting at 10:30AM    Call this number if you have problems the morning of surgery [phone]     Remember: Do not eat food After Midnight. YOU MAY HAVE CLEAR LIQUIDS FROM MIDNIGHT UNTIL 7:00AM. NOTHING BY MOUTH AFTER 7:00AM! BRUSH YOUR TEETH MORNING OF SURGERY AND RINSE YOUR MOUTH OUT, NO CHEWING GUM CANDY OR MINTS.       CLEAR LIQUID DIET   Foods Allowed                                                                     Foods Excluded  Coffee and tea, regular and decaf                             liquids that you cannot  Plain Jell-O in any flavor                                             see through such as: Fruit ices (not with fruit pulp)                                     milk, soups, orange juice  Iced Popsicles                                    All solid food Carbonated beverages, regular and diet                                    Cranberry, grape and apple juices Sports drinks like Gatorade Lightly seasoned clear broth or consume(fat free) Sugar, honey syrup  Sample Menu Breakfast                                Lunch                                     Supper Cranberry juice                    Beef broth                            Chicken broth Jell-O                                     Grape juice  Apple juice Coffee or tea                        Jell-O                                      Popsicle                                                Coffee or tea                        Coffee or tea  _____________________________________________________________________     Take these medicines the morning of surgery with A SIP OF WATER: none                                 You may not have any metal on your body including hair pins and              piercings  Do not wear jewelry,  make-up, lotions, powders or perfumes, deodorant              Men may shave face and neck.   Do not bring valuables to the hospital. [facility] IS NOT             RESPONSIBLE   FOR VALUABLES.  Contacts, dentures or bridgework may not be worn into surgery.  Leave suitcase in the car. After surgery it may be brought to your room.                 Please read over the following fact sheets you were given: _____________________________________________________________________             [facility] - Preparing for Surgery Before surgery, you can play an important role.  Because skin is not sterile, your skin needs to be as free of germs as possible.  You can reduce the number of germs on your skin by washing with CHG (chlorahexidine gluconate) soap before surgery.  CHG is an antiseptic cleaner which kills germs and bonds with the skin to continue killing germs even after washing. Please DO NOT use if you have an allergy to CHG or antibacterial soaps.  If your skin becomes reddened/irritated stop using the CHG and inform your nurse when you arrive at Short Stay. Do not shave (including legs and underarms) for at least 48 hours prior to the first CHG shower.  You may shave your face/neck. Please follow these instructions carefully:  1.  Shower with CHG Soap the night before surgery and the  morning of Surgery.  2.  If you choose to wash your hair, wash your hair first as usual with your  normal  shampoo.  3.  After you shampoo, rinse your hair and body thoroughly to remove the  shampoo.                           4.  Use CHG as you would any other liquid soap.  You can apply chg directly  to the skin and wash  Gently with a scrungie or clean washcloth.  5.  Apply the CHG Soap to your body ONLY FROM THE NECK DOWN.   Do not use on face/ open                           Wound or open sores. Avoid contact with eyes, ears mouth and genitals (private parts).                        Wash face,  Genitals (private parts) with your normal soap.             6.  Wash thoroughly, paying special attention to the area where your surgery  will be performed.  7.  Thoroughly rinse your body with warm water from the neck down.  8.  DO NOT shower/wash with your normal soap after using and rinsing off  the CHG Soap.                9.  Pat yourself dry with a clean towel.            10.  Wear clean pajamas.            11.  Place clean sheets on your bed the night of your first shower and do not  sleep with pets. Day of Surgery : Do not apply any lotions/deodorants the morning of surgery.  Please wear clean clothes to the hospital/surgery center.  FAILURE TO FOLLOW THESE INSTRUCTIONS MAY RESULT IN THE CANCELLATION OF YOUR SURGERY PATIENT SIGNATURE_________________________________  NURSE SIGNATURE__________________________________  ________________________________________________________________________   Robert Petty  An incentive spirometer is a tool that can help keep your lungs clear and active. This tool measures how well you are filling your lungs with each breath. Taking long deep breaths may help reverse or decrease the chance of developing breathing (pulmonary) problems (especially infection) following:  A long period of time when you are unable to move or be active. BEFORE THE PROCEDURE   If the spirometer includes an indicator to show your best effort, your nurse or respiratory therapist will set it to a desired goal.  If possible, sit up straight or lean slightly forward. Try not to slouch.  Hold the incentive spirometer in an upright position. INSTRUCTIONS FOR USE  1. Sit on the edge of your bed if possible, or sit up as far as you can in bed or on a chair. 2. Hold the incentive spirometer in an upright position. 3. Breathe out normally. 4. Place the mouthpiece in your mouth and seal your lips tightly around it. 5. Breathe in slowly and as deeply as  possible, raising the piston or the ball toward the top of the column. 6. Hold your breath for 3-5 seconds or for as long as possible. Allow the piston or ball to fall to the bottom of the column. 7. Remove the mouthpiece from your mouth and breathe out normally. 8. Rest for a few seconds and repeat Steps 1 through 7 at least 10 times every 1-2 hours when you are awake. Take your time and take a few normal breaths between deep breaths. 9. The spirometer may include an indicator to show your best effort. Use the indicator as a goal to work toward during each repetition. 10. After each set of 10 deep breaths, practice coughing to be sure your lungs are clear. If you have an incision (the cut made at the time of  surgery), support your incision when coughing by placing a pillow or rolled up towels firmly against it. Once you are able to get out of bed, walk around indoors and cough well. You may stop using the incentive spirometer when instructed by your caregiver.  RISKS AND COMPLICATIONS  Take your time so you do not get dizzy or light-headed.  If you are in pain, you may need to take or ask for pain medication before doing incentive spirometry. It is harder to take a deep breath if you are having pain. AFTER USE  Rest and breathe slowly and easily.  It can be helpful to keep track of a log of your progress. Your caregiver can provide you with a simple table to help with this. If you are using the spirometer at home, follow these instructions: [place] IF:   You are having difficultly using the spirometer.  You have trouble using the spirometer as often as instructed.  Your pain medication is not giving enough relief while using the spirometer.  You develop fever of 100.5 F (38.1 C) or higher. SEEK IMMEDIATE MEDICAL CARE IF:   You cough up bloody sputum that had not been present before.  You develop fever of 102 F (38.9 C) or greater.  You develop worsening pain at or near  the incision site. MAKE SURE YOU:   Understand these instructions.  Will watch your condition.  Will get help right away if you are not doing well or get worse. Document Released: 07/03/2006 Document Revised: 05/15/2011 Document Reviewed: 09/03/2006 [facility] Patient Information 2014 [place], [place].   ________________________________________________________________________

## 2017-11-28 NOTE — Progress Notes (Signed)
Cardiac clearance , Tereso Newcomer , PA-C 11-14-17 epic   EKG 11-14-17 epic   Stress Test 09-14-15 epic

## 2017-11-29 ENCOUNTER — Other Ambulatory Visit: Payer: Self-pay

## 2017-11-29 ENCOUNTER — Encounter (HOSPITAL_COMMUNITY)
Admission: RE | Admit: 2017-11-29 | Discharge: 2017-11-29 | Disposition: A | Discharge status: Home / Self Care | Payer: Medicare Other | Source: Ambulatory Visit | Attending: Orthopedic Surgery | Admitting: Orthopedic Surgery

## 2017-11-29 ENCOUNTER — Encounter (HOSPITAL_COMMUNITY): Payer: Self-pay

## 2017-11-29 ENCOUNTER — Other Ambulatory Visit: Payer: Self-pay | Admitting: Orthopedic Surgery

## 2017-11-29 ENCOUNTER — Ambulatory Visit (HOSPITAL_COMMUNITY)
Admission: RE | Admit: 2017-11-29 | Discharge: 2017-11-29 | Disposition: A | Discharge status: Home / Self Care | Payer: Medicare Other | Source: Ambulatory Visit | Attending: Orthopedic Surgery | Admitting: Orthopedic Surgery

## 2017-11-29 DIAGNOSIS — Z01818 Encounter for other preprocedural examination: Secondary | ICD-10-CM | POA: Diagnosis present

## 2017-11-29 DIAGNOSIS — M1712 Unilateral primary osteoarthritis, left knee: Secondary | ICD-10-CM | POA: Insufficient documentation

## 2017-11-29 LAB — CBC WITH DIFFERENTIAL/PLATELET
BASOS ABS: 0.1 10*3/uL (ref 0.0–0.1)
BASOS PCT: 1 %
EOS ABS: 0.3 10*3/uL (ref 0.0–0.7)
EOS PCT: 5 %
HCT: 44 % (ref 39.0–52.0)
Hemoglobin: 14.4 g/dL (ref 13.0–17.0)
Lymphocytes Relative: 22 %
Lymphs Abs: 1.4 10*3/uL (ref 0.7–4.0)
MCH: 30.7 pg (ref 26.0–34.0)
MCHC: 32.7 g/dL (ref 30.0–36.0)
MCV: 93.8 fL (ref 78.0–100.0)
MONO ABS: 0.4 10*3/uL (ref 0.1–1.0)
Monocytes Relative: 7 %
Neutro Abs: 4.3 10*3/uL (ref 1.7–7.7)
Neutrophils Relative %: 65 %
PLATELETS: 231 10*3/uL (ref 150–400)
RBC: 4.69 MIL/uL (ref 4.22–5.81)
RDW: 13.3 % (ref 11.5–15.5)
WBC: 6.5 10*3/uL (ref 4.0–10.5)

## 2017-11-29 LAB — URINALYSIS, ROUTINE W REFLEX MICROSCOPIC
Bilirubin Urine: NEGATIVE
GLUCOSE, UA: NEGATIVE mg/dL
HGB URINE DIPSTICK: NEGATIVE
Ketones, ur: NEGATIVE mg/dL
LEUKOCYTES UA: NEGATIVE
Nitrite: NEGATIVE
PROTEIN: NEGATIVE mg/dL
Specific Gravity, Urine: 1.023 (ref 1.005–1.030)
pH: 6 (ref 5.0–8.0)

## 2017-11-29 LAB — BASIC METABOLIC PANEL
ANION GAP: 7 (ref 5–15)
BUN: 21 mg/dL (ref 8–23)
CALCIUM: 9 mg/dL (ref 8.9–10.3)
CO2: 27 mmol/L (ref 22–32)
CREATININE: 0.81 mg/dL (ref 0.61–1.24)
Chloride: 108 mmol/L (ref 98–111)
GFR calc Af Amer: >60 mL/min (ref >60)
GLUCOSE: 121 mg/dL — AB (ref 70–99)
Potassium: 4.3 mmol/L (ref 3.5–5.1)
Sodium: 142 mmol/L (ref 135–145)

## 2017-11-29 LAB — PROTIME-INR
INR: 0.98
Prothrombin Time: 12.9 seconds (ref 11.4–15.2)

## 2017-11-29 LAB — ABO/RH: ABO/RH(D): O NEG

## 2017-11-29 LAB — SURGICAL PCR SCREEN
MRSA, PCR: NEGATIVE
STAPHYLOCOCCUS AUREUS: NEGATIVE

## 2017-11-29 LAB — APTT: APTT: 31 s (ref 24–36)

## 2017-11-29 IMAGING — CR DG CHEST 2V
2 series · 2 of 2 positions shown · non-contrast
Comparison: None.

CLINICAL DATA: Preop knee surgery.  Remote history of smoking.

EXAM:
CHEST - 2 VIEW

[w chest pa]
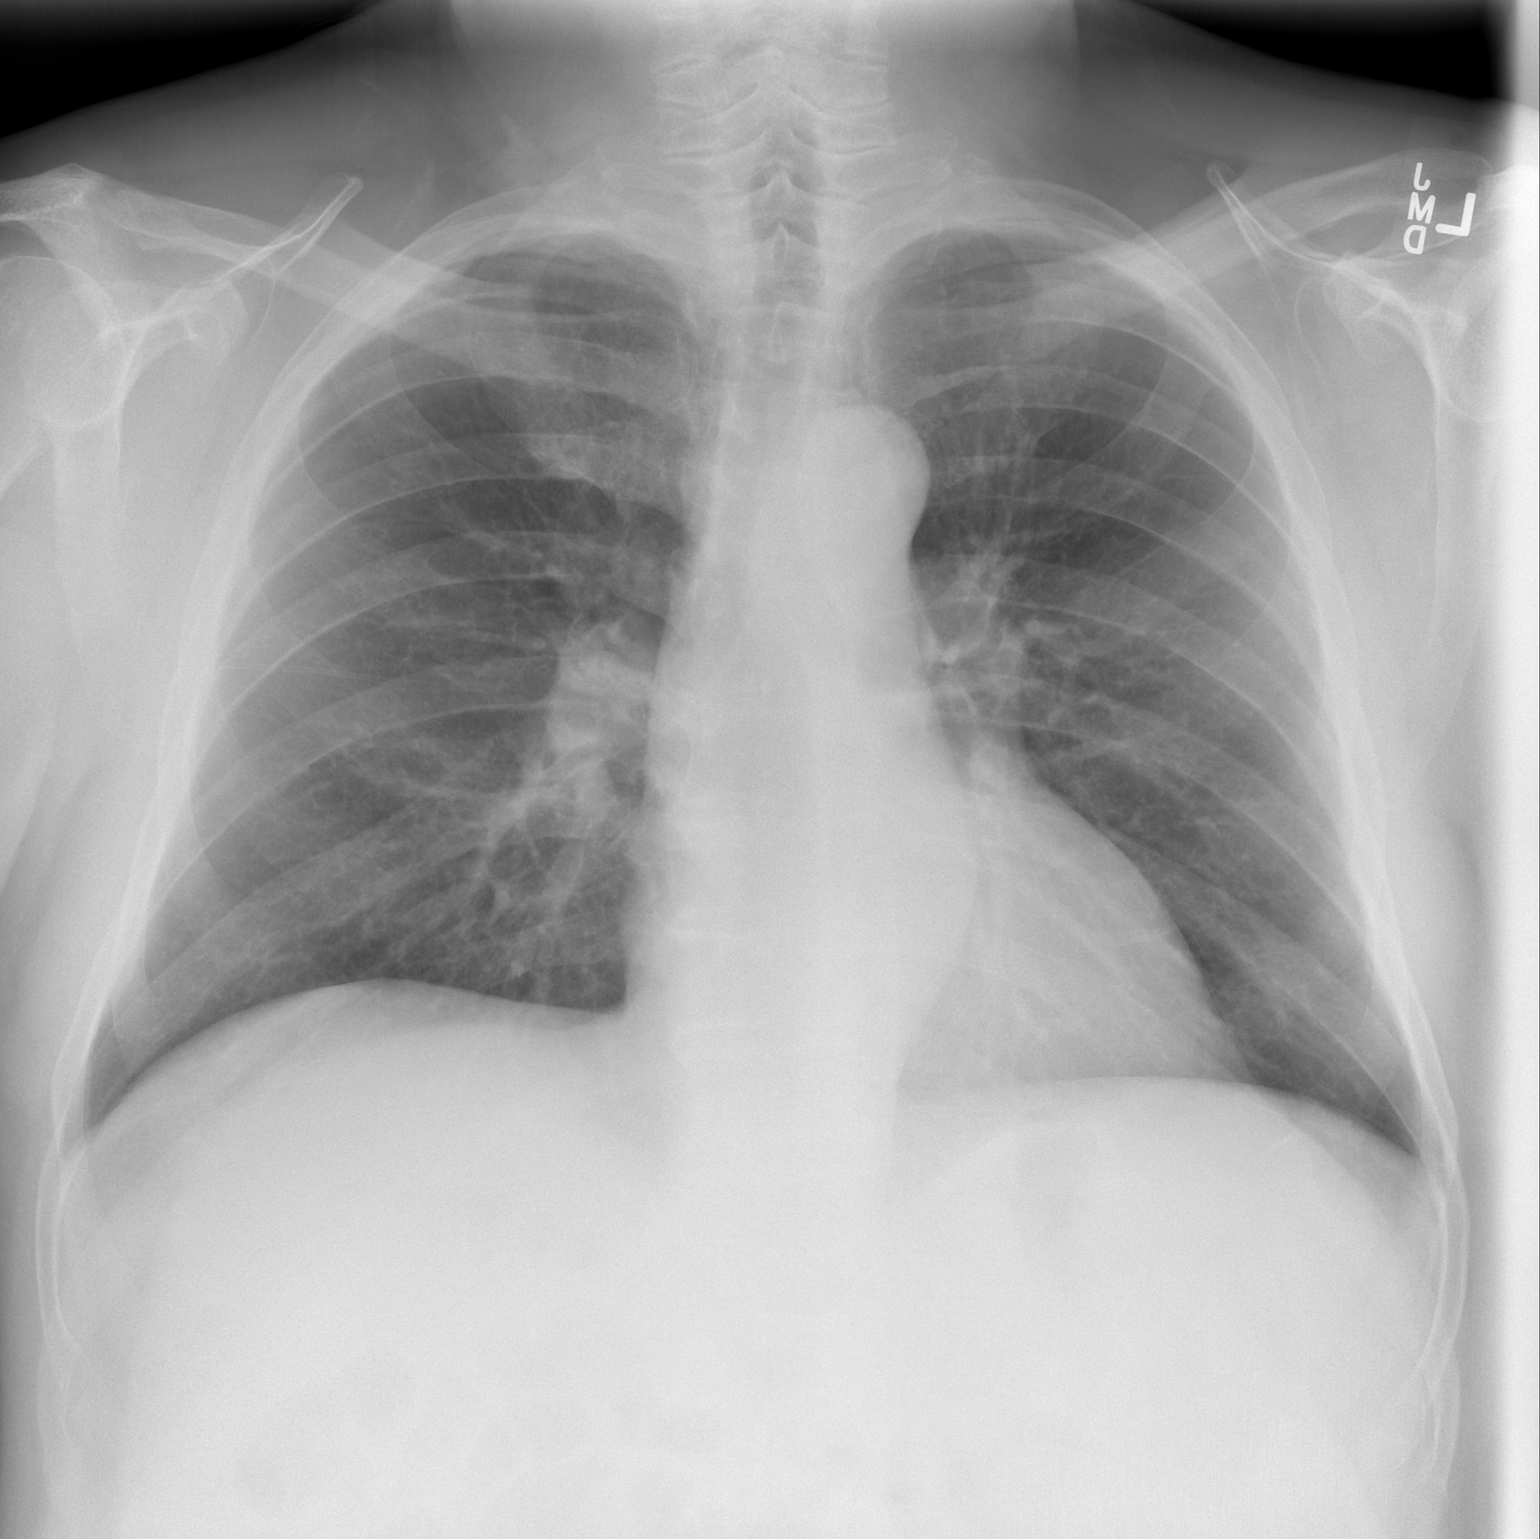

[w chest lat]
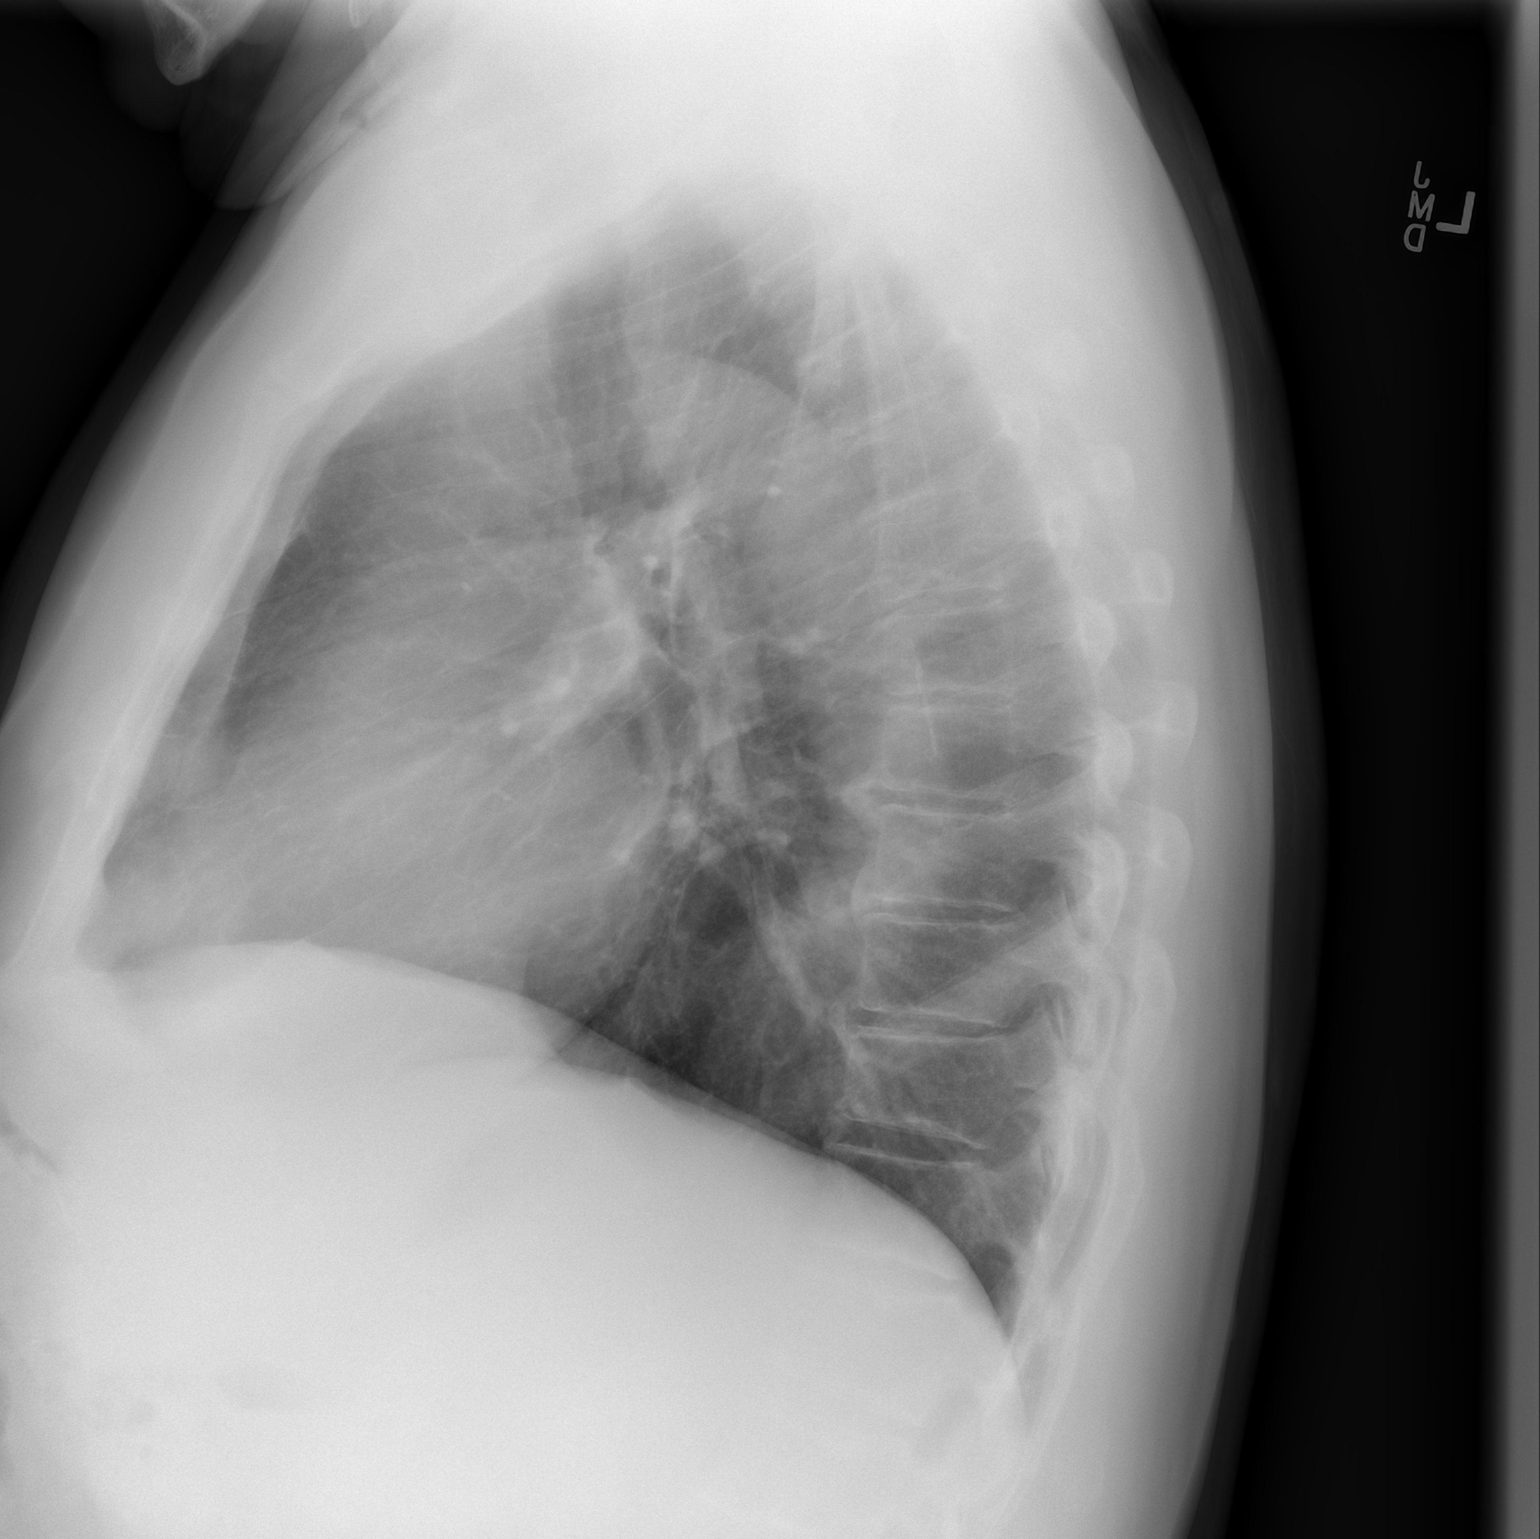

[2 of 2 positions shown; findings below may reference images not displayed]

FINDINGS: The cardiomediastinal silhouette is within normal limits. The lungs
are well inflated and clear. There is no evidence of pleural
effusion or pneumothorax. No acute osseous abnormality is
identified.
IMPRESSION: No active cardiopulmonary disease.

## 2017-11-30 DIAGNOSIS — M1712 Unilateral primary osteoarthritis, left knee: Secondary | ICD-10-CM

## 2017-11-30 NOTE — H&P (Signed)
TOTAL KNEE ADMISSION H&P  Patient is being admitted for left total knee arthroplasty.  Subjective:  Chief Complaint:left knee pain.  HPI: Robert Petty, 72 y.o. male, has a history of pain and functional disability in the left knee due to arthritis and has failed non-surgical conservative treatments for greater than 12 weeks to includeNSAID's and/or analgesics, corticosteriod injections, viscosupplementation injections, flexibility and strengthening excercises and activity modification.  Onset of symptoms was gradual, starting 5 years ago with gradually worsening course since that time. The patient noted no past surgery on the left knee(s).  Patient currently rates pain in the left knee(s) at 10 out of 10 with activity. Patient has night pain, worsening of pain with activity and weight bearing, pain that interferes with activities of daily living, pain with passive range of motion, crepitus and joint swelling.  Patient has evidence of joint space narrowing by imaging studies.  There is no active infection.  Patient Active Problem List   Diagnosis Date Noted  . Abnormal myocardial perfusion study 09/15/2015  . Hyperlipidemia 09/15/2015   Past Medical History:  Diagnosis Date  . Hyperlipidemia   . Kidney stones     Past Surgical History:  Procedure Laterality Date  . BACK SURGERY    . CARDIAC CATHETERIZATION N/A 09/22/2015   Procedure: Left Heart Cath and Coronary Angiography;  Surgeon: Tonny Bollman, MD;  Location: [facility] INVASIVE CV LAB;  Service: Cardiovascular;  Laterality: N/A;  . HERNIATED DISC REPAIR    . SHOULDER SURGERY      No current facility-administered medications for this encounter.    Current Outpatient Medications  Medication Sig Dispense Refill Last Dose  . aspirin EC 81 MG tablet Take 1 tablet (81 mg total) by mouth daily.   Taking  . atorvastatin (LIPITOR) 40 MG tablet Take 1 tablet (40 mg total) by mouth daily. (Patient taking differently: Take 40 mg by mouth at  bedtime. ) 90 tablet 3 Taking  . Multiple Vitamin (MULTIVITAMIN WITH MINERALS) TABS tablet Take 1 tablet by mouth daily.   Taking   No Known Allergies  Social History   Tobacco Use  . Smoking status: Former Smoker    Last attempt to quit: 03/01/1977    Years since quitting: 40.7  . Smokeless tobacco: Never Used  Substance Use Topics  . Alcohol use: Yes    Comment: OCCASIONALLY    Family History  Problem Relation Age of Onset  . Alzheimer's disease Mother   . Hypertension Mother   . Hyperlipidemia Mother   . Prostate cancer Father   . Leukemia Sister   . Lung cancer Brother   . Brain cancer Brother   . Diabetes Brother   . Heart disease Brother   . Hyperlipidemia Brother   . Hypertension Brother   . Cancer Paternal Grandfather   . Hypertension Sister   . Diabetes Sister   . Hyperlipidemia Sister   . Hypertension Sister   . Diabetes Sister   . Hyperlipidemia Sister   . Hypertension Sister   . Diabetes Sister   . Hyperlipidemia Sister   . Hyperlipidemia Brother   . Diabetes Brother   . Hyperlipidemia Brother   . Diabetes Brother   . Hyperlipidemia Brother   . Diabetes Brother      Review of Systems  Constitutional: Negative.   HENT: Negative.   Eyes: Negative.   Respiratory: Negative.   Cardiovascular: Negative.   Gastrointestinal: Negative.   Genitourinary: Negative.   Musculoskeletal: Positive for joint pain.  Skin: Negative.  Neurological: Negative.   Endo/Heme/Allergies: Negative.   Psychiatric/Behavioral: Negative.     Objective:  Physical Exam  Constitutional: He is oriented to person, place, and time. He appears well-developed and well-nourished.  HENT:  Head: Normocephalic and atraumatic.  Eyes: Pupils are equal, round, and reactive to light.  Neck: Normal range of motion. Neck supple.  Cardiovascular: Intact distal pulses.  Respiratory: Effort normal.  Musculoskeletal: He exhibits tenderness.  No erythema, warmth, swelling noted.  Healing  abrasion over the anterior aspect of the left knee.  Full range of motion of flexion and extension of the knee.  Crepitus noted with range of motion.  Strength 5 out of 5 throughout testing.  Negative Lachman, negative posterior drawer, negative varus stress test, negative valgus stress testing, negative McMurray testing.  Neurological: He is alert and oriented to person, place, and time.  Skin: Skin is warm and dry.  Psychiatric: He has a normal mood and affect. His behavior is normal. Judgment and thought content normal.    Vital signs in last 24 hours:    Labs:   Estimated body mass index is 34.58 kg/m as calculated from the following:   Height as of 11/29/17: 5\' 10"  (1.778 m).   Weight as of 11/29/17: 109.3 kg.   Imaging Review Plain radiographs demonstrate  standing AP and Zoila Shutter view show progression of the medial compartment arthritis to the left knee, now with erosion of the femur into the medial tibia and there is obvious chondrocalcinosis of the lateral compartment as well.   Preoperative templating of the joint replacement has been completed, documented, and submitted to the Operating Room personnel in order to optimize intra-operative equipment management.   Patient's anticipated LOS is less than 2 midnights, meeting these requirements: - Younger than 46 - Lives within 1 hour of care - Has a competent adult at home to recover with post-op recover - NO history of  - Chronic pain requiring opiods  - Diabetes  - Coronary Artery Disease  - Heart failure  - Heart attack  - Stroke  - DVT/VTE  - Cardiac arrhythmia  - Respiratory Failure/COPD  - Renal failure  - Anemia  - Advanced Liver disease    Assessment/Plan:  End stage arthritis, left knee   The patient history, physical examination, clinical judgment of the provider and imaging studies are consistent with end stage degenerative joint disease of the left knee(s) and total knee arthroplasty is deemed  medically necessary. The treatment options including medical management, injection therapy arthroscopy and arthroplasty were discussed at length. The risks and benefits of total knee arthroplasty were presented and reviewed. The risks due to aseptic loosening, infection, stiffness, patella tracking problems, thromboembolic complications and other imponderables were discussed. The patient acknowledged the explanation, agreed to proceed with the plan and consent was signed. Patient is being admitted for inpatient treatment for surgery, pain control, PT, OT, prophylactic antibiotics, VTE prophylaxis, progressive ambulation and ADL's and discharge planning. The patient is planning to be discharged home with home health services

## 2017-12-02 MED ORDER — BUPIVACAINE LIPOSOME 1.3 % IJ SUSP
20.0000 mL | Freq: Once | INTRAMUSCULAR | Status: DC
  Filled 2017-12-02: qty 20

## 2017-12-02 MED ORDER — TRANEXAMIC ACID 1000 MG/10ML IV SOLN
2000.0000 mg | INTRAVENOUS | Status: DC
  Filled 2017-12-02: qty 20

## 2017-12-03 ENCOUNTER — Ambulatory Visit (HOSPITAL_COMMUNITY)
Admission: RE | Admit: 2017-12-03 | Discharge: 2017-12-04 | Disposition: A | Discharge status: Home / Self Care | Payer: Medicare Other | Source: Ambulatory Visit | Attending: Orthopedic Surgery | Admitting: Orthopedic Surgery

## 2017-12-03 ENCOUNTER — Ambulatory Visit (HOSPITAL_COMMUNITY): Payer: Medicare Other | Admitting: Anesthesiology

## 2017-12-03 ENCOUNTER — Telehealth (HOSPITAL_COMMUNITY): Payer: Self-pay | Admitting: *Deleted

## 2017-12-03 ENCOUNTER — Encounter (HOSPITAL_COMMUNITY)
Admission: RE | Disposition: A | Discharge status: Home / Self Care | Payer: Self-pay | Source: Ambulatory Visit | Attending: Orthopedic Surgery

## 2017-12-03 ENCOUNTER — Encounter (HOSPITAL_COMMUNITY): Payer: Self-pay

## 2017-12-03 ENCOUNTER — Other Ambulatory Visit: Payer: Self-pay

## 2017-12-03 DIAGNOSIS — Z96652 Presence of left artificial knee joint: Secondary | ICD-10-CM

## 2017-12-03 DIAGNOSIS — M25762 Osteophyte, left knee: Secondary | ICD-10-CM | POA: Insufficient documentation

## 2017-12-03 DIAGNOSIS — E669 Obesity, unspecified: Secondary | ICD-10-CM | POA: Diagnosis not present

## 2017-12-03 DIAGNOSIS — E785 Hyperlipidemia, unspecified: Secondary | ICD-10-CM | POA: Insufficient documentation

## 2017-12-03 DIAGNOSIS — Z87891 Personal history of nicotine dependence: Secondary | ICD-10-CM | POA: Insufficient documentation

## 2017-12-03 DIAGNOSIS — Z7982 Long term (current) use of aspirin: Secondary | ICD-10-CM | POA: Insufficient documentation

## 2017-12-03 DIAGNOSIS — Z6833 Body mass index (BMI) 33.0-33.9, adult: Secondary | ICD-10-CM | POA: Insufficient documentation

## 2017-12-03 DIAGNOSIS — Z79899 Other long term (current) drug therapy: Secondary | ICD-10-CM | POA: Insufficient documentation

## 2017-12-03 DIAGNOSIS — M1712 Unilateral primary osteoarthritis, left knee: Secondary | ICD-10-CM | POA: Diagnosis not present

## 2017-12-03 DIAGNOSIS — M25562 Pain in left knee: Secondary | ICD-10-CM | POA: Diagnosis present

## 2017-12-03 HISTORY — PX: TOTAL KNEE ARTHROPLASTY: SHX125

## 2017-12-03 LAB — TYPE AND SCREEN
ABO/RH(D): O NEG
Antibody Screen: NEGATIVE

## 2017-12-03 SURGERY — ARTHROPLASTY, KNEE, TOTAL
Anesthesia: Spinal | Site: Knee | Laterality: Left

## 2017-12-03 MED ORDER — ASPIRIN EC 81 MG PO TBEC: 81.0000 mg | DELAYED_RELEASE_TABLET | Freq: Two times a day (BID) | ORAL | 0 refills | Status: DC

## 2017-12-03 MED ORDER — ATORVASTATIN CALCIUM 40 MG PO TABS
40.0000 mg | ORAL_TABLET | Freq: Every day | ORAL | Status: DC
  Administered 2017-12-03: 40 mg via ORAL
  Filled 2017-12-03: qty 1

## 2017-12-03 MED ORDER — METOCLOPRAMIDE HCL 5 MG PO TABS: 5.0000 mg | ORAL_TABLET | Freq: Three times a day (TID) | ORAL | Status: DC | PRN

## 2017-12-03 MED ORDER — 0.9 % SODIUM CHLORIDE (POUR BTL) OPTIME
TOPICAL | Status: DC | PRN
  Administered 2017-12-03: 1000 mL

## 2017-12-03 MED ORDER — TRANEXAMIC ACID 1000 MG/10ML IV SOLN
INTRAVENOUS | Status: DC | PRN
  Administered 2017-12-03: 2000 mg via TOPICAL

## 2017-12-03 MED ORDER — FENTANYL CITRATE (PF) 100 MCG/2ML IJ SOLN
50.0000 ug | INTRAMUSCULAR | Status: DC | PRN
  Administered 2017-12-03: 50 ug via INTRAVENOUS

## 2017-12-03 MED ORDER — PROMETHAZINE HCL 25 MG/ML IJ SOLN: 6.2500 mg | INTRAMUSCULAR | Status: DC | PRN

## 2017-12-03 MED ORDER — CHLORHEXIDINE GLUCONATE 4 % EX LIQD: 60.0000 mL | Freq: Once | CUTANEOUS | Status: DC

## 2017-12-03 MED ORDER — ONDANSETRON HCL 4 MG PO TABS: 4.0000 mg | ORAL_TABLET | Freq: Four times a day (QID) | ORAL | Status: DC | PRN

## 2017-12-03 MED ORDER — METOCLOPRAMIDE HCL 5 MG/ML IJ SOLN: 5.0000 mg | Freq: Three times a day (TID) | INTRAMUSCULAR | Status: DC | PRN

## 2017-12-03 MED ORDER — DOCUSATE SODIUM 100 MG PO CAPS
100.0000 mg | ORAL_CAPSULE | Freq: Two times a day (BID) | ORAL | Status: DC
  Administered 2017-12-03 – 2017-12-04 (×2): 100 mg via ORAL
  Filled 2017-12-03 (×2): qty 1

## 2017-12-03 MED ORDER — ACETAMINOPHEN 325 MG PO TABS: 325.0000 mg | ORAL_TABLET | Freq: Four times a day (QID) | ORAL | Status: DC | PRN

## 2017-12-03 MED ORDER — HYDROMORPHONE HCL 1 MG/ML IJ SOLN: 0.2500 mg | INTRAMUSCULAR | Status: DC | PRN

## 2017-12-03 MED ORDER — ONDANSETRON HCL 4 MG/2ML IJ SOLN
INTRAMUSCULAR | Status: DC | PRN
  Administered 2017-12-03: 4 mg via INTRAVENOUS

## 2017-12-03 MED ORDER — FLEET ENEMA 7-19 GM/118ML RE ENEM: 1.0000 | ENEMA | Freq: Once | RECTAL | Status: DC | PRN

## 2017-12-03 MED ORDER — MIDAZOLAM HCL 2 MG/2ML IJ SOLN
INTRAMUSCULAR | Status: AC
  Administered 2017-12-03: 2 mg via INTRAVENOUS
  Filled 2017-12-03: qty 2

## 2017-12-03 MED ORDER — MIDAZOLAM HCL 2 MG/2ML IJ SOLN
1.0000 mg | INTRAMUSCULAR | Status: DC | PRN
  Administered 2017-12-03: 2 mg via INTRAVENOUS

## 2017-12-03 MED ORDER — PROPOFOL 10 MG/ML IV BOLUS
INTRAVENOUS | Status: AC
  Filled 2017-12-03: qty 40

## 2017-12-03 MED ORDER — DEXAMETHASONE SODIUM PHOSPHATE 10 MG/ML IJ SOLN
INTRAMUSCULAR | Status: DC | PRN
  Administered 2017-12-03: 10 mg via INTRAVENOUS

## 2017-12-03 MED ORDER — HYDROMORPHONE HCL 1 MG/ML IJ SOLN: 0.5000 mg | INTRAMUSCULAR | Status: DC | PRN

## 2017-12-03 MED ORDER — GABAPENTIN 300 MG PO CAPS
300.0000 mg | ORAL_CAPSULE | Freq: Three times a day (TID) | ORAL | Status: DC
  Administered 2017-12-03 – 2017-12-04 (×3): 300 mg via ORAL
  Filled 2017-12-03 (×3): qty 1

## 2017-12-03 MED ORDER — FENTANYL CITRATE (PF) 100 MCG/2ML IJ SOLN
INTRAMUSCULAR | Status: AC
  Administered 2017-12-03: 50 ug via INTRAVENOUS
  Filled 2017-12-03: qty 2

## 2017-12-03 MED ORDER — CEFAZOLIN SODIUM-DEXTROSE 2-4 GM/100ML-% IV SOLN
2.0000 g | INTRAVENOUS | Status: AC
  Administered 2017-12-03: 2 g via INTRAVENOUS
  Filled 2017-12-03: qty 100

## 2017-12-03 MED ORDER — METHOCARBAMOL 500 MG PO TABS: 500.0000 mg | ORAL_TABLET | Freq: Four times a day (QID) | ORAL | Status: DC | PRN

## 2017-12-03 MED ORDER — TRANEXAMIC ACID 1000 MG/10ML IV SOLN
1000.0000 mg | Freq: Once | INTRAVENOUS | Status: AC
  Administered 2017-12-03: 1000 mg via INTRAVENOUS
  Filled 2017-12-03: qty 1000

## 2017-12-03 MED ORDER — POLYETHYLENE GLYCOL 3350 17 G PO PACK: 17.0000 g | PACK | Freq: Every day | ORAL | Status: DC | PRN

## 2017-12-03 MED ORDER — OXYCODONE-ACETAMINOPHEN 5-325 MG PO TABS: 1.0000 | ORAL_TABLET | ORAL | 0 refills | Status: DC | PRN

## 2017-12-03 MED ORDER — SODIUM CHLORIDE 0.9 % IV SOLN
INTRAVENOUS | Status: DC | PRN
  Administered 2017-12-03: 10 ug/min via INTRAVENOUS

## 2017-12-03 MED ORDER — TRANEXAMIC ACID 1000 MG/10ML IV SOLN
1000.0000 mg | INTRAVENOUS | Status: AC
  Administered 2017-12-03: 1000 mg via INTRAVENOUS
  Filled 2017-12-03: qty 10

## 2017-12-03 MED ORDER — BISACODYL 5 MG PO TBEC: 5.0000 mg | DELAYED_RELEASE_TABLET | Freq: Every day | ORAL | Status: DC | PRN

## 2017-12-03 MED ORDER — PROPOFOL 500 MG/50ML IV EMUL
INTRAVENOUS | Status: DC | PRN
  Administered 2017-12-03: 65 ug/kg/min via INTRAVENOUS

## 2017-12-03 MED ORDER — ONDANSETRON HCL 4 MG/2ML IJ SOLN: 4.0000 mg | Freq: Four times a day (QID) | INTRAMUSCULAR | Status: DC | PRN

## 2017-12-03 MED ORDER — OXYCODONE HCL 5 MG PO TABS: 5.0000 mg | ORAL_TABLET | ORAL | Status: DC | PRN

## 2017-12-03 MED ORDER — ALUM & MAG HYDROXIDE-SIMETH 200-200-20 MG/5ML PO SUSP: 30.0000 mL | ORAL | Status: DC | PRN

## 2017-12-03 MED ORDER — SODIUM CHLORIDE 0.9 % IJ SOLN
INTRAMUSCULAR | Status: AC
  Filled 2017-12-03: qty 50

## 2017-12-03 MED ORDER — ROPIVACAINE HCL 7.5 MG/ML IJ SOLN
INTRAMUSCULAR | Status: DC | PRN
  Administered 2017-12-03: 20 mL via PERINEURAL

## 2017-12-03 MED ORDER — KCL IN DEXTROSE-NACL 20-5-0.45 MEQ/L-%-% IV SOLN
INTRAVENOUS | Status: DC
  Administered 2017-12-03 – 2017-12-04 (×2): via INTRAVENOUS
  Filled 2017-12-03 (×3): qty 1000

## 2017-12-03 MED ORDER — DEXAMETHASONE SODIUM PHOSPHATE 10 MG/ML IJ SOLN
10.0000 mg | Freq: Once | INTRAMUSCULAR | Status: AC
  Administered 2017-12-04: 10 mg via INTRAVENOUS
  Filled 2017-12-03: qty 1

## 2017-12-03 MED ORDER — PROPOFOL 10 MG/ML IV BOLUS
INTRAVENOUS | Status: AC
  Filled 2017-12-03: qty 20

## 2017-12-03 MED ORDER — SODIUM CHLORIDE 0.9 % IJ SOLN
INTRAMUSCULAR | Status: DC | PRN
  Administered 2017-12-03: 50 mL

## 2017-12-03 MED ORDER — METHOCARBAMOL 500 MG IVPB - SIMPLE MED
INTRAVENOUS | Status: AC
  Filled 2017-12-03: qty 50

## 2017-12-03 MED ORDER — MENTHOL 3 MG MT LOZG: 1.0000 | LOZENGE | OROMUCOSAL | Status: DC | PRN

## 2017-12-03 MED ORDER — BUPIVACAINE-EPINEPHRINE (PF) 0.25% -1:200000 IJ SOLN
INTRAMUSCULAR | Status: AC
  Filled 2017-12-03: qty 30

## 2017-12-03 MED ORDER — METHOCARBAMOL 500 MG IVPB - SIMPLE MED
500.0000 mg | Freq: Four times a day (QID) | INTRAVENOUS | Status: DC | PRN
  Administered 2017-12-03: 500 mg via INTRAVENOUS
  Filled 2017-12-03: qty 50

## 2017-12-03 MED ORDER — BUPIVACAINE-EPINEPHRINE (PF) 0.25% -1:200000 IJ SOLN
INTRAMUSCULAR | Status: DC | PRN
  Administered 2017-12-03: 30 mL

## 2017-12-03 MED ORDER — SODIUM CHLORIDE 0.9 % IR SOLN
Status: DC | PRN
  Administered 2017-12-03: 1000 mL

## 2017-12-03 MED ORDER — BUPIVACAINE LIPOSOME 1.3 % IJ SUSP
INTRAMUSCULAR | Status: DC | PRN
  Administered 2017-12-03: 20 mL

## 2017-12-03 MED ORDER — CLONIDINE HCL (ANALGESIA) 100 MCG/ML EP SOLN
EPIDURAL | Status: DC | PRN
  Administered 2017-12-03: 50 ug

## 2017-12-03 MED ORDER — PANTOPRAZOLE SODIUM 40 MG PO TBEC
40.0000 mg | DELAYED_RELEASE_TABLET | Freq: Every day | ORAL | Status: DC
  Administered 2017-12-03 – 2017-12-04 (×2): 40 mg via ORAL
  Filled 2017-12-03 (×2): qty 1

## 2017-12-03 MED ORDER — PHENOL 1.4 % MT LIQD
1.0000 | OROMUCOSAL | Status: DC | PRN
  Filled 2017-12-03: qty 177

## 2017-12-03 MED ORDER — BUPIVACAINE IN DEXTROSE 0.75-8.25 % IT SOLN
INTRATHECAL | Status: DC | PRN
  Administered 2017-12-03: 1.6 mL via INTRATHECAL

## 2017-12-03 MED ORDER — ASPIRIN 81 MG PO CHEW
81.0000 mg | CHEWABLE_TABLET | Freq: Two times a day (BID) | ORAL | Status: DC
  Administered 2017-12-03 – 2017-12-04 (×2): 81 mg via ORAL
  Filled 2017-12-03 (×2): qty 1

## 2017-12-03 MED ORDER — DIPHENHYDRAMINE HCL 12.5 MG/5ML PO ELIX: 12.5000 mg | ORAL_SOLUTION | ORAL | Status: DC | PRN

## 2017-12-03 MED ORDER — WATER FOR IRRIGATION, STERILE IR SOLN
Status: DC | PRN
  Administered 2017-12-03: 2000 mL

## 2017-12-03 MED ORDER — TIZANIDINE HCL 2 MG PO TABS: 2.0000 mg | ORAL_TABLET | Freq: Four times a day (QID) | ORAL | 0 refills | Status: DC | PRN

## 2017-12-03 MED ORDER — PROPOFOL 10 MG/ML IV BOLUS
INTRAVENOUS | Status: DC | PRN
  Administered 2017-12-03: 15 mg via INTRAVENOUS

## 2017-12-03 MED ORDER — LACTATED RINGERS IV SOLN
INTRAVENOUS | Status: DC
  Administered 2017-12-03: 14:00:00 via INTRAVENOUS
  Administered 2017-12-03: 1000 mL via INTRAVENOUS

## 2017-12-03 SURGICAL SUPPLY — 50 items
ATTUNE MED DOME PAT 41 KNEE (Knees) ×2 IMPLANT
ATTUNE MED DOME PAT 41MM KNEE (Knees) ×1 IMPLANT
ATTUNE PS FEM LT SZ 8 CEM KNEE (Femur) ×3 IMPLANT
ATTUNE PSRP INSR SZ8 5 KNEE (Insert) ×2 IMPLANT
ATTUNE PSRP INSR SZ8 5MM KNEE (Insert) ×1 IMPLANT
BAG DECANTER FOR FLEXI CONT (MISCELLANEOUS) ×3 IMPLANT
BAG ZIPLOCK 12X15 (MISCELLANEOUS) ×3 IMPLANT
BANDAGE ELASTIC 6 VELCRO ST LF (GAUZE/BANDAGES/DRESSINGS) ×3 IMPLANT
BLADE SAG 18X100X1.27 (BLADE) ×3 IMPLANT
BLADE SAW SGTL 11.0X1.19X90.0M (BLADE) IMPLANT
BNDG ELASTIC 6X10 VLCR STRL LF (GAUZE/BANDAGES/DRESSINGS) ×3 IMPLANT
BOWL SMART MIX CTS (DISPOSABLE) ×3 IMPLANT
CEMENT HV SMART SET (Cement) ×6 IMPLANT
COVER SURGICAL LIGHT HANDLE (MISCELLANEOUS) ×3 IMPLANT
CUFF TOURN SGL QUICK 34 (TOURNIQUET CUFF) ×2
CUFF TRNQT CYL 34X4X40X1 (TOURNIQUET CUFF) ×1 IMPLANT
DECANTER SPIKE VIAL GLASS SM (MISCELLANEOUS) ×9 IMPLANT
DRAPE U-SHAPE 47X51 STRL (DRAPES) ×3 IMPLANT
DRSG AQUACEL AG ADV 3.5X10 (GAUZE/BANDAGES/DRESSINGS) ×3 IMPLANT
DURAPREP 26ML APPLICATOR (WOUND CARE) ×3 IMPLANT
ELECT REM PT RETURN 15FT ADLT (MISCELLANEOUS) ×3 IMPLANT
GLOVE BIO SURGEON STRL SZ7.5 (GLOVE) ×3 IMPLANT
GLOVE BIO SURGEON STRL SZ8.5 (GLOVE) ×3 IMPLANT
GLOVE BIOGEL PI IND STRL 8 (GLOVE) ×1 IMPLANT
GLOVE BIOGEL PI IND STRL 9 (GLOVE) ×1 IMPLANT
GLOVE BIOGEL PI INDICATOR 8 (GLOVE) ×2
GLOVE BIOGEL PI INDICATOR 9 (GLOVE) ×2
GOWN STRL REUS W/TWL XL LVL3 (GOWN DISPOSABLE) ×6 IMPLANT
HANDPIECE INTERPULSE COAX TIP (DISPOSABLE) ×2
HOOD PEEL AWAY FLYTE STAYCOOL (MISCELLANEOUS) ×9 IMPLANT
NEEDLE HYPO 22GX1.5 SAFETY (NEEDLE) ×3 IMPLANT
NS IRRIG 1000ML POUR BTL (IV SOLUTION) ×3 IMPLANT
PACK ICE MAXI GEL EZY WRAP (MISCELLANEOUS) ×3 IMPLANT
PACK TOTAL KNEE CUSTOM (KITS) ×3 IMPLANT
PIN STEINMAN FIXATION KNEE (PIN) ×6 IMPLANT
POSITIONER SURGICAL ARM (MISCELLANEOUS) ×3 IMPLANT
SET HNDPC FAN SPRY TIP SCT (DISPOSABLE) ×1 IMPLANT
STAPLER VISISTAT 35W (STAPLE) IMPLANT
SUT VIC AB 1 CTX 36 (SUTURE) ×2
SUT VIC AB 1 CTX36XBRD ANBCTR (SUTURE) ×1 IMPLANT
SUT VIC AB 2-0 CT1 27 (SUTURE) ×2
SUT VIC AB 2-0 CT1 TAPERPNT 27 (SUTURE) ×1 IMPLANT
SUT VIC AB 3-0 CT1 27 (SUTURE) ×2
SUT VIC AB 3-0 CT1 TAPERPNT 27 (SUTURE) ×1 IMPLANT
SYR CONTROL 10ML LL (SYRINGE) ×6 IMPLANT
TIBIAL BASE ROT PLAT SZ 9 KNEE (Miscellaneous) ×3 IMPLANT
TRAY FOLEY MTR SLVR 16FR STAT (SET/KITS/TRAYS/PACK) ×3 IMPLANT
WATER STERILE IRR 1000ML POUR (IV SOLUTION) ×6 IMPLANT
WRAP KNEE MAXI GEL POST OP (GAUZE/BANDAGES/DRESSINGS) ×3 IMPLANT
YANKAUER SUCT BULB TIP 10FT TU (MISCELLANEOUS) ×3 IMPLANT

## 2017-12-03 NOTE — Plan of Care (Signed)

## 2017-12-03 NOTE — Discharge Instructions (Signed)

## 2017-12-03 NOTE — Op Note (Signed)
PATIENT ID:      Robert Petty  MRN:     [id number] DOB/AGE:    03-27-1945 / 72 y.o.       OPERATIVE REPORT    DATE OF PROCEDURE:  12/03/2017       PREOPERATIVE DIAGNOSIS:   LEFT KNEE OSTEOARTHRITIS      Estimated body mass index is 33.72 kg/m as calculated from the following:   Height as of this encounter: 5\' 10"  (1.778 m).   Weight as of this encounter: 106.6 kg.                                                        POSTOPERATIVE DIAGNOSIS:   LEFT KNEE OSTEOARTHRITIS                                                                      PROCEDURE:  Procedure(s): LEFT TOTAL KNEE ARTHROPLASTY Using DepuyAttune RP implants #8L Femur, #9Tibia, 5 mm Attune RP bearing, 41 Patella     SURGEON: Nestor Lewandowsky    ASSISTANT:   Tomi Likens. [employer]   (Present and scrubbed throughout the case, critical for assistance with exposure, retraction, instrumentation, and closure.)         ANESTHESIA: Spinal, 20cc Exparel, 50cc 0.25% Marcaine  EBL: 350cc  FLUID REPLACEMENT: 1600cc crystalloid  TOURNIQUET TIME:  Drains: None  Tranexamic Acid: 1gm IV, 2gm topical  COMPLICATIONS:  None         INDICATIONS FOR PROCEDURE: The patient has  LEFT KNEE OSTEOARTHRITIS, Var deformities, XR shows bone on bone arthritis, lateral subluxation of tibia. Patient has failed all conservative measures including anti-inflammatory medicines, narcotics, attempts at  exercise and weight loss, cortisone injections and viscosupplementation.  Risks and benefits of surgery have been discussed, questions answered.   DESCRIPTION OF PROCEDURE: The patient identified by armband, received  IV antibiotics, in the holding area at [facility]. Patient taken to the operating room, appropriate anesthetic  monitors were attached, and Spinal anesthesia was  induced. Tourniquet  applied high to the operative thigh. Lateral post and foot positioner  applied to the table, the lower extremity was then prepped and draped  in  usual sterile fashion from the toes to the tourniquet. Time-out procedure was performed. We began the operation, with the knee flexed 120 degrees, by making the anterior midline incision starting at handbreadth above the patella going over the patella 1 cm medial to and 4 cm distal to the tibial tubercle. Small bleeders in the skin and the  subcutaneous tissue identified and cauterized. Transverse retinaculum was incised and reflected medially and a medial parapatellar arthrotomy was accomplished. the patella was everted and theprepatellar fat pad resected. The superficial medial collateral  ligament was then elevated from anterior to posterior along the proximal  flare of the tibia and anterior half of the menisci resected. The knee was hyperflexed exposing bone on bone arthritis. Peripheral and notch osteophytes as well as the cruciate ligaments were then resected. We continued to  work our way around posteriorly along the proximal tibia,  and externally  rotated the tibia subluxing it out from underneath the femur. A McHale  retractor was placed through the notch and a lateral Hohmann retractor  placed, and we then drilled through the proximal tibia in line with the  axis of the tibia followed by an intramedullary guide rod and 2-degree  posterior slope cutting guide. The tibial cutting guide, 3 degree posterior sloped, was pinned into place allowing resection of 4 mm of bone medially and 12 mm of bone laterally. Satisfied with the tibial resection, we then  entered the distal femur 2 mm anterior to the PCL origin with the  intramedullary guide rod and applied the distal femoral cutting guide  set at 9 mm, with 5 degrees of valgus. This was pinned along the  epicondylar axis. At this point, the distal femoral cut was accomplished without difficulty. We then sized for a #8L femoral component and pinned the guide in 3 degrees of external rotation. The chamfer cutting guide was pinned into place. The  anterior, posterior, and chamfer cuts were accomplished without difficulty followed by  the Attune RP box cutting guide and the box cut. We also removed posterior osteophytes from the posterior femoral condyles. At this  time, the knee was brought into full extension. We checked our  extension and flexion gaps and found them symmetric for a 5 mm bearing. Distracting in extension with a lamina spreader, the posterior horns of the menisci were removed, and Exparel, diluted to 60 cc, with 20cc NS, and 20cc 0.5% Marcaine,was injected into the capsule and synovium of the knee. The posterior patella cut was accomplished with the 9.5 mm Attune cutting guide, sized for a 41mm dome, and the fixation pegs drilled.The knee  was then once again hyperflexed exposing the proximal tibia. We sized for a # 9 tibial base plate, applied the smokestack and the conical reamer followed by the the Delta fin keel punch. We then hammered into place the Attune RP trial femoral component, drilled the lugs, inserted a  5 mm trial bearing, trial patellar button, and took the knee through range of motion from 0-130 degrees. No thumb pressure was required for patellar Tracking. At this point, the limb was wrapped with an Esmarch bandage and the tourniquet inflated to 350 mmHg. All trial components were removed, mating surfaces irrigated with pulse lavage, and dried with suction and sponges. 10 cc of the Exparel solution was applied to the cancellus bone of the patella distal femur and proximal tibia.  After waiting 1 minute, the bony surfaces were again, dried with sponges. A double batch of DePuy HV cement with 1500 mg of Zinacef was mixed and applied to all bony metallic mating surfaces except for the posterior condyles of the femur itself. In order, we hammered into place the tibial tray and removed excess cement, the femoral component and removed excess cement. The final Attune RP bearing  was inserted, and the knee brought to full  extension with compression.  The patellar button was clamped into place, and excess cement  removed. While the cement cured the wound was irrigated out with normal saline solution pulse lavage. Ligament stability and patellar tracking were checked and found to be excellent. The parapatellar arthrotomy was closed with  running #1 Vicryl suture. The subcutaneous tissue with 0 and 2-0 undyed  Vicryl suture, and the skin with running 3-0 SQ vicryl. A dressing of Xeroform,  4 x 4, dressing sponges, Webril, and Ace wrap applied. The patient  awakened, and taken  to recovery room without difficulty.   Nestor Lewandowsky 12/03/2017, 2:54 PM

## 2017-12-03 NOTE — Evaluation (Signed)
Physical Therapy Evaluation Patient Details Name: Robert Petty MRN: [id number] DOB: April 17, 1945 Today's Date: 12/03/2017   History of Present Illness  Pt is a 72 YO male s/p L TKR on 12/03/17. PMH includes HLD, kidney stones, back surgery, cardiac cath, shoulder surgery.   Clinical Impression   Pt presents with difficulty performing bed mobility and transfers, L knee pain especially with transfers, antalgic gait, and decreased tolerance for ambulation. Pt to benefit from acute PT to address deficits. Pt ambulated hallway distance with RW with min guard assist, requiring some cuing for RW use. PT to progress mobility as able, and will continue to follow acutely.     Follow Up Recommendations Follow surgeon's recommendation for DC plan and follow-up therapies;Supervision for mobility/OOB(HHPT )    Equipment Recommendations  None recommended by PT    Recommendations for Other Services       Precautions / Restrictions Precautions Precautions: Fall Restrictions Weight Bearing Restrictions: No Other Position/Activity Restrictions: WBAT       Mobility  Bed Mobility Overal bed mobility: Needs Assistance Bed Mobility: Supine to Sit     Supine to sit: Min guard;HOB elevated     General bed mobility comments: Min guard for safety, increased time and effort to get to EOB. Pt reports feeling slightly dizzy upon sitting EOB, resolved quickly.   Transfers Overall transfer level: Needs assistance Equipment used: Rolling walker (2 wheeled) Transfers: Sit to/from Stand Sit to Stand: Min guard;From elevated surface         General transfer comment: min guard for safety. Verbal cuing for hand placement, pt with self-steadying upon standing.   Ambulation/Gait Ambulation/Gait assistance: Min guard Gait Distance (Feet): 80 Feet Assistive device: Rolling walker (2 wheeled) Gait Pattern/deviations: Step-to pattern;Step-through pattern;Decreased stance time - left;Decreased weight shift  to left;Antalgic;Trunk flexed Gait velocity: decr    General Gait Details: Min guard for safety. Verbal cuing for sequencing, placement in RW especially during change of direction.   Stairs            Wheelchair Mobility    Modified Rankin (Stroke Patients Only)       Balance Overall balance assessment: Mild deficits observed, not formally tested                                           Pertinent Vitals/Pain Pain Assessment: 0-10 Pain Score: 3  Pain Location: L knee  Pain Descriptors / Indicators: Sore;Operative site guarding Pain Intervention(s): Limited activity within patient's tolerance;Ice applied;Monitored during session    Home Living Family/patient expects to be discharged to:: Private residence Living Arrangements: Spouse/significant other;Other relatives(64 year old grandson ) Available Help at Discharge: Family;Available PRN/intermittently Type of Home: House Home Access: Stairs to enter Entrance Stairs-Rails: None Entrance Stairs-Number of Steps: 2  Home Layout: Two level;Able to live on main level with bedroom/bathroom Home Equipment: Dan Humphreys - 2 wheels;Cane - single point      Prior Function Level of Independence: Independent               Hand Dominance   Dominant Hand: Right    Extremity/Trunk Assessment   Upper Extremity Assessment Upper Extremity Assessment: Overall WFL for tasks assessed    Lower Extremity Assessment Lower Extremity Assessment: Overall WFL for tasks assessed;LLE deficits/detail LLE Deficits / Details: suspected post-surgical weakness; able to perform SLR x1 during bed mobility, quad set, AA knee flexion  LLE Sensation: WNL    Cervical / Trunk Assessment Cervical / Trunk Assessment: Normal  Communication   Communication: No difficulties  Cognition Arousal/Alertness: Awake/alert Behavior During Therapy: WFL for tasks assessed/performed Overall Cognitive Status: Within Functional Limits for  tasks assessed                                        General Comments      Exercises Total Joint Exercises Ankle Circles/Pumps: AROM;Both;5 reps;Supine Goniometric ROM: knee AAROM 5-45*, limited by pain and stiffness   Assessment/Plan    PT Assessment Patient needs continued PT services  PT Problem List Decreased strength;Pain;Decreased range of motion;Decreased activity tolerance;Decreased knowledge of use of DME;Decreased balance;Decreased mobility       PT Treatment Interventions DME instruction;Therapeutic activities;Gait training;Therapeutic exercise;Patient/family education;Stair training;Balance training;Functional mobility training    PT Goals (Current goals can be found in the Care Plan section)  Acute Rehab PT Goals PT Goal Formulation: With patient Time For Goal Achievement: 12/17/17 Potential to Achieve Goals: Good    Frequency 7X/week   Barriers to discharge        Co-evaluation               AM-PAC PT "6 Clicks" Daily Activity  Outcome Measure Difficulty turning over in bed (including adjusting bedclothes, sheets and blankets)?: Unable Difficulty moving from lying on back to sitting on the side of the bed? : Unable Difficulty sitting down on and standing up from a chair with arms (e.g., wheelchair, bedside commode, etc,.)?: Unable Help needed moving to and from a bed to chair (including a wheelchair)?: A Little Help needed walking in hospital room?: A Little Help needed climbing 3-5 steps with a railing? : A Little 6 Click Score: 12    End of Session Equipment Utilized During Treatment: Gait belt Activity Tolerance: Patient tolerated treatment well Patient left: in chair;with chair alarm set;with call bell/phone within reach;with family/visitor present;with SCD's reapplied(with bone foam donned ) Nurse Communication: Mobility status PT Visit Diagnosis: Other abnormalities of gait and mobility (R26.89);Difficulty in walking, not  elsewhere classified (R26.2)    Time: 1610-9604 PT Time Calculation (min) (ACUTE ONLY): 28 min   Charges:   PT Evaluation $PT Eval Low Complexity: 1 Low PT Treatments $Gait Training: 8-22 mins        Nicola Police, PT Acute Rehabilitation Services Pager [phone]  Office [phone]   Tyrone Apple D Despina Hidden 12/03/2017, 7:33 PM

## 2017-12-03 NOTE — Anesthesia Preprocedure Evaluation (Signed)
Anesthesia Evaluation  Patient identified by MRN, date of birth, ID band Patient awake    Reviewed: Allergy & Precautions, NPO status , Patient's Chart, lab work & pertinent test results  Airway Mallampati: II  TM Distance: >3 FB Neck ROM: Full    Dental no notable dental hx.    Pulmonary neg pulmonary ROS, former smoker,    Pulmonary exam normal breath sounds clear to auscultation       Cardiovascular negative cardio ROS Normal cardiovascular exam Rhythm:Regular Rate:Normal     Neuro/Psych negative neurological ROS  negative psych ROS   GI/Hepatic negative GI ROS, Neg liver ROS,   Endo/Other  obesity  Renal/GU negative Renal ROS  negative genitourinary   Musculoskeletal negative musculoskeletal ROS (+)   Abdominal   Peds negative pediatric ROS (+)  Hematology negative hematology ROS (+)   Anesthesia Other Findings   Reproductive/Obstetrics negative OB ROS                             Anesthesia Physical Anesthesia Plan  ASA: II  Anesthesia Plan: Spinal   Post-op Pain Management:    Induction: Intravenous  PONV Risk Score and Plan: 1 and Ondansetron  Airway Management Planned: Simple Face Mask  Additional Equipment:   Intra-op Plan:   Post-operative Plan:   Informed Consent: I have reviewed the patients History and Physical, chart, labs and discussed the procedure including the risks, benefits and alternatives for the proposed anesthesia with the patient or authorized representative who has indicated his/her understanding and acceptance.   Dental advisory given  Plan Discussed with: CRNA and Surgeon  Anesthesia Plan Comments:         Anesthesia Quick Evaluation

## 2017-12-03 NOTE — Anesthesia Procedure Notes (Signed)
Spinal  Patient location during procedure: OR Start time: 12/03/2017 1:20 PM End time: 12/03/2017 1:25 PM Staffing Anesthesiologist: Leonides Grills, MD Performed: anesthesiologist  Preanesthetic Checklist Completed: patient identified, surgical consent, pre-op evaluation, timeout performed, IV checked, risks and benefits discussed and monitors and equipment checked Spinal Block Patient position: sitting Prep: DuraPrep Patient monitoring: cardiac monitor, continuous pulse ox and blood pressure Approach: midline Location: L4-5 Injection technique: single-shot Needle Needle type: Pencan  Needle gauge: 24 G Needle length: 9 cm Assessment Sensory level: T10 Additional Notes Functioning IV was confirmed and monitors were applied. Sterile prep and drape, including hand hygiene and sterile gloves were used. The patient was positioned and the spine was prepped. The skin was anesthetized with lidocaine.  Free flow of clear CSF was obtained prior to injecting local anesthetic into the CSF.  The spinal needle aspirated freely following injection.  The needle was carefully withdrawn.  The patient tolerated the procedure well.

## 2017-12-03 NOTE — Transfer of Care (Signed)
Immediate Anesthesia Transfer of Care Note  Patient: Robert Petty  Procedure(s) Performed: LEFT TOTAL KNEE ARTHROPLASTY (Left Knee)  Patient Location: PACU  Anesthesia Type:Spinal  Level of Consciousness: sedated  Airway & Oxygen Therapy: Patient Spontanous Breathing and Patient connected to face mask oxygen  Post-op Assessment: Report given to RN and Post -op Vital signs reviewed and stable  Post vital signs: Reviewed and stable  Last Vitals:  Vitals Value Taken Time  BP 117/75 12/03/2017  3:18 PM  Temp    Pulse 64 12/03/2017  3:20 PM  Resp    SpO2 98 % 12/03/2017  3:20 PM  Vitals shown include unvalidated device data.  Last Pain:  Vitals:   12/03/17 1215  TempSrc:   PainSc: 0-No pain      Patients Stated Pain Goal: 4 (12/03/17 1049)  Complications: No apparent anesthesia complications

## 2017-12-03 NOTE — Anesthesia Procedure Notes (Signed)
Anesthesia Procedure Image    

## 2017-12-03 NOTE — Anesthesia Procedure Notes (Signed)
Anesthesia Regional Block: Adductor canal block   Pre-Anesthetic Checklist: ,, timeout performed, Correct Patient, Correct Site, Correct Laterality, Correct Procedure, Correct Position, site marked, Risks and benefits discussed,  Surgical consent,  Pre-op evaluation,  At surgeon's request and post-op pain management  Laterality: Left  Prep: chloraprep       Needles:  Injection technique: Single-shot  Needle Type: Echogenic Needle     Needle Length: 9cm      Additional Needles:   Procedures:,,,, ultrasound used (permanent image in chart),,,,  Narrative:  Start time: 12/03/2017 12:02 PM End time: 12/03/2017 12:10 PM Injection made incrementally with aspirations every 5 mL.  Performed by: Personally  Anesthesiologist: Eilene Ghazi, MD  Additional Notes: Patient tolerated the procedure well without complications

## 2017-12-03 NOTE — Anesthesia Postprocedure Evaluation (Signed)
Anesthesia Post Note  Patient: Robert Petty  Procedure(s) Performed: LEFT TOTAL KNEE ARTHROPLASTY (Left Knee)     Patient location during evaluation: PACU Anesthesia Type: Spinal and Regional Level of consciousness: oriented and awake and alert Pain management: pain level controlled Vital Signs Assessment: post-procedure vital signs reviewed and stable Respiratory status: spontaneous breathing, respiratory function stable and patient connected to nasal cannula oxygen Cardiovascular status: blood pressure returned to baseline and stable Postop Assessment: no headache, no backache, no apparent nausea or vomiting and spinal receding Anesthetic complications: no    Last Vitals:  Vitals:   12/03/17 1931 12/03/17 2047  BP: (!) 150/93 126/75  Pulse: 65 78  Resp: 16 17  Temp: 36.7 C 36.8 C  SpO2: 97% 98%    Last Pain:  Vitals:   12/03/17 2047  TempSrc: Oral  PainSc:                  Catheryn Bacon Soma Bachand

## 2017-12-03 NOTE — Interval H&P Note (Signed)
History and Physical Interval Note:  12/03/2017 10:48 AM  Robert Petty  has presented today for surgery, with the diagnosis of LEFT KNEE OSTEOARTHRITIS  The various methods of treatment have been discussed with the patient and family. After consideration of risks, benefits and other options for treatment, the patient has consented to  Procedure(s): LEFT TOTAL KNEE ARTHROPLASTY (Left) as a surgical intervention .  The patient's history has been reviewed, patient examined, no change in status, stable for surgery.  I have reviewed the patient's chart and labs.  Questions were answered to the patient's satisfaction.     Nestor Lewandowsky

## 2017-12-03 NOTE — Progress Notes (Signed)
Assisted Dr. Rose with left, ultrasound guided, adductor canal block. Side rails up, monitors on throughout procedure. See vital signs in flow sheet. Tolerated Procedure well.  

## 2017-12-04 ENCOUNTER — Encounter (HOSPITAL_COMMUNITY): Payer: Self-pay | Admitting: Orthopedic Surgery

## 2017-12-04 DIAGNOSIS — M1712 Unilateral primary osteoarthritis, left knee: Secondary | ICD-10-CM | POA: Diagnosis not present

## 2017-12-04 LAB — BASIC METABOLIC PANEL
Anion gap: 6 (ref 5–15)
BUN: 15 mg/dL (ref 8–23)
CHLORIDE: 107 mmol/L (ref 98–111)
CO2: 28 mmol/L (ref 22–32)
CREATININE: 0.88 mg/dL (ref 0.61–1.24)
Calcium: 9.1 mg/dL (ref 8.9–10.3)
GFR calc Af Amer: >60 mL/min (ref >60)
Glucose, Bld: 166 mg/dL — ABNORMAL HIGH (ref 70–99)
Potassium: 4.3 mmol/L (ref 3.5–5.1)
Sodium: 141 mmol/L (ref 135–145)

## 2017-12-04 LAB — CBC
HEMATOCRIT: 39.5 % (ref 39.0–52.0)
Hemoglobin: 13 g/dL (ref 13.0–17.0)
MCH: 30.6 pg (ref 26.0–34.0)
MCHC: 32.9 g/dL (ref 30.0–36.0)
MCV: 92.9 fL (ref 78.0–100.0)
Platelets: 243 10*3/uL (ref 150–400)
RBC: 4.25 MIL/uL (ref 4.22–5.81)
RDW: 13.1 % (ref 11.5–15.5)
WBC: 16.9 10*3/uL — AB (ref 4.0–10.5)

## 2017-12-04 NOTE — Progress Notes (Signed)
PATIENT ID: Robert Petty  MRN: [id number]  DOB/AGE:  Feb 24, 1946 / 72 y.o.  1 Day Post-Op Procedure(s) (LRB): LEFT TOTAL KNEE ARTHROPLASTY (Left)    PROGRESS NOTE Subjective: Patient is alert, oriented, no Nausea, no Vomiting, yes passing gas. Taking PO well. Denies SOB, Chest or Calf Pain. Using Incentive Spirometer, PAS in place. Ambulate in hallwaqy, Patient reports pain as 0/10 .    Objective: Vital signs in last 24 hours: Vitals:   12/03/17 1931 12/03/17 2047 12/04/17 0054 12/04/17 0604  BP: (Abnormal) 150/93 126/75 136/88 137/86  Pulse: 65 78 88 (Abnormal) 101  Resp: 16 17 17    Temp: 98 F (36.7 C) 98.2 F (36.8 C) 97.7 F (36.5 C) 98.1 F (36.7 C)  TempSrc: Oral Oral Oral Oral  SpO2: 97% 98% 95% 96%  Weight:      Height:          Intake/Output from previous day: I/O last 3 completed shifts: In: 2455.5 [P.O.:660; I.V.:1595.5; IV Piggyback:200] Out: 3100 [Urine:2900; Blood:200]   Intake/Output this shift: No intake/output data recorded.   LABORATORY DATA: Recent Labs    12/04/17 0531  WBC 16.9*  HGB 13.0  HCT 39.5  PLT 243  NA 141  K 4.3  CL 107  CO2 28  BUN 15  CREATININE 0.88  GLUCOSE 166*  CALCIUM 9.1    Examination: Neurologically intact ABD soft Neurovascular intact Sensation intact distally Intact pulses distally Dorsiflexion/Plantar flexion intact Incision: dressing C/D/I No cellulitis present Compartment soft}  Assessment:   1 Day Post-Op Procedure(s) (LRB): LEFT TOTAL KNEE ARTHROPLASTY (Left) ADDITIONAL DIAGNOSIS: Expected Acute Blood Loss Anemia,   Patient's anticipated LOS is less than 2 midnights, meeting these requirements: - Younger than 71 - Lives within 1 hour of care - Has a competent adult at home to recover with post-op recover - NO history of  - Chronic pain requiring opiods  - Diabetes  - Coronary Artery Disease  - Heart failure  - Heart attack  - Stroke  - DVT/VTE  - Cardiac arrhythmia  - Respiratory  Failure/COPD  - Renal failure  - Anemia  - Advanced Liver disease       Plan: PT/OT WBAT, AROM and PROM  DVT Prophylaxis:  SCDx72hrs, ASA 81 mg BID x 2 weeks DISCHARGE PLAN: Home, today DISCHARGE NEEDS: HHPT, Walker and 3-in-1 comode seat     Nestor Lewandowsky 12/04/2017, 7:10 AM

## 2017-12-04 NOTE — Discharge Summary (Signed)
Patient ID: KEYSHON STEIN MRN: [id number] DOB/AGE: 1945-08-01 72 y.o.  Admit date: 12/03/2017 Discharge date: 12/04/2017  Admission Diagnoses:  Principal Problem:   Degenerative arthritis of left knee Active Problems:   S/P total knee arthroplasty, left   Discharge Diagnoses:  Same  Past Medical History:  Diagnosis Date  . Hyperlipidemia   . Kidney stones     Surgeries: Procedure(s): LEFT TOTAL KNEE ARTHROPLASTY on 12/03/2017   Consultants:   Discharged Condition: Improved  Hospital Course: DEVESH MONFORTE is an 72 y.o. male who was admitted 12/03/2017 for operative treatment ofDegenerative arthritis of left knee. Patient has severe unremitting pain that affects sleep, daily activities, and work/hobbies. After pre-op clearance the patient was taken to the operating room on 12/03/2017 and underwent  Procedure(s): LEFT TOTAL KNEE ARTHROPLASTY.    Patient was given perioperative antibiotics:  Anti-infectives (From admission, onward)   Start     Dose/Rate Route Frequency Ordered Stop   12/03/17 1015  ceFAZolin (ANCEF) IVPB 2g/100 mL premix     2 g 200 mL/hr over 30 Minutes Intravenous On call to O.R. 12/03/17 1011 12/03/17 1323       Patient was given sequential compression devices, early ambulation, and chemoprophylaxis to prevent DVT.  Patient benefited maximally from hospital stay and there were no complications.    Recent vital signs:  Patient Vitals for the past 24 hrs:  BP Temp Temp src Pulse Resp SpO2 Height Weight  12/04/17 0604 137/86 98.1 F (36.7 C) Oral (Abnormal) 101 no documentation 96 % no documentation no documentation  12/04/17 0054 136/88 97.7 F (36.5 C) Oral 88 17 95 % no documentation no documentation  12/03/17 2047 126/75 98.2 F (36.8 C) Oral 78 17 98 % no documentation no documentation  12/03/17 1931 (Abnormal) 150/93 98 F (36.7 C) Oral 65 16 97 % no documentation no documentation  12/03/17 1838 (Abnormal) 156/82 98.4 F (36.9 C) Oral 68 16  96 % no documentation no documentation  12/03/17 1740 140/82 97.7 F (36.5 C) Oral 68 14 98 % no documentation no documentation  12/03/17 1627 124/73 (Abnormal) 97.5 F (36.4 C) Oral (Abnormal) 56 14 98 % no documentation no documentation  12/03/17 1545 129/78 98 F (36.7 C) no documentation 60 16 96 % no documentation no documentation  12/03/17 1530 125/81 no documentation no documentation 63 15 99 % no documentation no documentation  12/03/17 1518 117/75 98 F (36.7 C) no documentation 63 15 99 % no documentation no documentation  12/03/17 1225 119/78 no documentation no documentation (Abnormal) 53 15 98 % no documentation no documentation  12/03/17 1224 no documentation no documentation no documentation (Abnormal) 53 16 97 % no documentation no documentation  12/03/17 1223 no documentation no documentation no documentation (Abnormal) 55 17 97 % no documentation no documentation  12/03/17 1222 no documentation no documentation no documentation (Abnormal) 54 16 97 % no documentation no documentation  12/03/17 1221 no documentation no documentation no documentation 61 17 98 % no documentation no documentation  12/03/17 1220 131/73 no documentation no documentation (Abnormal) 54 15 98 % no documentation no documentation  12/03/17 1219 no documentation no documentation no documentation 60 11 99 % no documentation no documentation  12/03/17 1218 no documentation no documentation no documentation 63 13 99 % no documentation no documentation  12/03/17 1217 no documentation no documentation no documentation (Abnormal) 57 16 98 % no documentation no documentation  12/03/17 1216 no documentation no documentation no documentation (Abnormal) 56 14 97 % no documentation no  documentation  12/03/17 1215 124/77 no documentation no documentation (Abnormal) 56 13 96 % no documentation no documentation  12/03/17 1214 no documentation no documentation no documentation (Abnormal) 58 13 97 % no documentation no  documentation  12/03/17 1213 no documentation no documentation no documentation 63 15 96 % no documentation no documentation  12/03/17 1212 130/70 no documentation no documentation (Abnormal) 57 18 96 % no documentation no documentation  12/03/17 1211 no documentation no documentation no documentation (Abnormal) 57 no documentation 97 % no documentation no documentation  12/03/17 1210 no documentation no documentation no documentation (Abnormal) 55 no documentation 99 % no documentation no documentation  12/03/17 1209 no documentation no documentation no documentation (Abnormal) 53 no documentation 100 % no documentation no documentation  12/03/17 1208 no documentation no documentation no documentation (Abnormal) 53 14 100 % no documentation no documentation  12/03/17 1207 no documentation no documentation no documentation (Abnormal) 57 no documentation 100 % no documentation no documentation  12/03/17 1206 no documentation no documentation no documentation (Abnormal) 54 no documentation 100 % no documentation no documentation  12/03/17 1205 no documentation no documentation no documentation 61 no documentation 100 % no documentation no documentation  12/03/17 1204 no documentation no documentation no documentation 60 no documentation 100 % no documentation no documentation  12/03/17 1203 132/78 no documentation no documentation (Abnormal) 54 no documentation 99 % no documentation no documentation  12/03/17 1049 no documentation no documentation no documentation no documentation no documentation no documentation 5\' 10"  (1.778 m) 106.6 kg  12/03/17 1023 (Abnormal) 103/58 98.3 F (36.8 C) Oral 66 16 99 % no documentation no documentation     Recent laboratory studies:  Recent Labs    12/04/17 0531  WBC 16.9*  HGB 13.0  HCT 39.5  PLT 243  NA 141  K 4.3  CL 107  CO2 28  BUN 15  CREATININE 0.88  GLUCOSE 166*  CALCIUM 9.1     Discharge Medications:   Allergies as of 12/04/2017   No  Known Allergies     Medication List    Take these medications   aspirin EC 81 MG tablet Take 1 tablet (81 mg total) by mouth 2 (two) times daily. What changed:  when to take this   atorvastatin 40 MG tablet Commonly known as:  LIPITOR Take 1 tablet (40 mg total) by mouth daily. What changed:  when to take this   multivitamin with minerals Tabs tablet Take 1 tablet by mouth daily.   oxyCODONE-acetaminophen 5-325 MG tablet Commonly known as:  PERCOCET/ROXICET Take 1 tablet by mouth every 4 (four) hours as needed for severe pain.   tiZANidine 2 MG tablet Commonly known as:  ZANAFLEX Take 1 tablet (2 mg total) by mouth every 6 (six) hours as needed.        Durable Medical Equipment  (From admission, onward)         Start     Ordered   12/03/17 1622  DME Walker rolling  Once    Question:  Patient needs a walker to treat with the following condition  Answer:  Status post total left knee replacement   12/03/17 1621   12/03/17 1622  DME 3 n 1  Once     12/03/17 1621           Discharge Care Instructions  (From admission, onward)         Start     Ordered   12/04/17 0000  Change dressing    Comments:  Change dressing if drainage > 40%   12/04/17 1610          Diagnostic Studies: Dg Chest 2 View  Result Date: 11/29/2017 CLINICAL DATA:  Preop knee surgery.  Remote history of smoking. EXAM: CHEST - 2 VIEW COMPARISON:  None. FINDINGS: The cardiomediastinal silhouette is within normal limits. The lungs are well inflated and clear. There is no evidence of pleural effusion or pneumothorax. No acute osseous abnormality is identified. IMPRESSION: No active cardiopulmonary disease. Electronically Signed   By: Sebastian Ache M.D.   On: 11/29/2017 13:59    Disposition: Discharge disposition: 01-Home or Self Care       Discharge Instructions    Call MD / Call 911   Complete by:  As directed    If you experience chest pain or shortness of breath, CALL 911 and be  transported to the hospital emergency room.  If you develope a fever above 101 F, pus (white drainage) or increased drainage or redness at the wound, or calf pain, call your surgeon's office.   Change dressing   Complete by:  As directed    Change dressing if drainage > 40%   Constipation Prevention   Complete by:  As directed    Drink plenty of fluids.  Prune juice may be helpful.  You may use a stool softener, such as Colace (over the counter) 100 mg twice a day.  Use MiraLax (over the counter) for constipation as needed.   Diet - low sodium heart healthy   Complete by:  As directed    Increase activity slowly as tolerated   Complete by:  As directed       Follow-up Information    Gean Birchwood, MD In 2 weeks.   Specialty:  Orthopedic Surgery Contact information: [address] [place] [address] [phone]            Signed: Nestor Lewandowsky 12/04/2017, 7:12 AM

## 2017-12-04 NOTE — Care Management Note (Signed)
Case Management Note  Patient Details  Name: Robert Petty MRN: [id number] Date of Birth: 1945/10/12  Subjective/Objective:    Discharge planning, spoke with patient and spouse at bedside. Have chosen [facility] PT, evaluate and treat.   Action/Plan: Contacted [facility] for referral. Needs RW, [employer] delivered to home preop. [phone]                Expected Discharge Date:  12/04/17               Expected Discharge Plan:  Home w Home Health Services  In-House Referral:  NA  Discharge planning Services  CM Consult  Post Acute Care Choice:  Durable Medical Equipment, Home Health Choice offered to:  Patient  DME Arranged:  Walker rolling DME Agency:  [employer]  HH Arranged:  PT HH Agency:  [facility]  Status of Service:  Completed, signed off  If discussed at [employer] [facility] [employer], dates discussed:    Additional Comments:  Alexis Goodell, RN 12/04/2017, 10:35 AM

## 2017-12-04 NOTE — Progress Notes (Signed)
Physical Therapy Treatment Patient Details Name: Robert Petty MRN: [id number] DOB: 02/17/1946 Today's Date: 12/04/2017    History of Present Illness Pt is a 72 YO male s/p L TKR on 12/03/17. PMH includes HLD, kidney stones, back surgery, cardiac cath, shoulder surgery.     PT Comments    Pt is progressing well with mobility, he ambulated 200' with RW. Instructed pt in HEP and completed stair training. He is ready to DC home from PT standpoint.   Follow Up Recommendations  Follow surgeon's recommendation for DC plan and follow-up therapies;Supervision for mobility/OOB;Home health PT(HHPT )     Equipment Recommendations  None recommended by PT    Recommendations for Other Services       Precautions / Restrictions Precautions Precautions: Fall Restrictions Weight Bearing Restrictions: No Other Position/Activity Restrictions: WBAT     Mobility  Bed Mobility               General bed mobility comments: up in recliner  Transfers Overall transfer level: Modified independent Equipment used: Rolling walker (2 wheeled) Transfers: Sit to/from Stand           General transfer comment: good safety awareness and hand placement  Ambulation/Gait Ambulation/Gait assistance: Modified independent (Device/Increase time) Gait Distance (Feet): 200 Feet Assistive device: Rolling walker (2 wheeled) Gait Pattern/deviations: Step-through pattern;Step-to pattern Gait velocity: WFL   General Gait Details: good sequencing, no loss of balance   Stairs Stairs: Yes Stairs assistance: Min assist Stair Management: No rails;Backwards;With walker Number of Stairs: 2     Wheelchair Mobility    Modified Rankin (Stroke Patients Only)       Balance Overall balance assessment: Modified Independent                                          Cognition Arousal/Alertness: Awake/alert Behavior During Therapy: WFL for tasks assessed/performed Overall Cognitive  Status: Within Functional Limits for tasks assessed                                        Exercises Total Joint Exercises Ankle Circles/Pumps: AROM;Both;Supine;10 reps Quad Sets: AROM;Left;5 reps;Supine Short Arc Quad: AROM;Left;10 reps;Supine Heel Slides: AAROM;Left;10 reps;Supine Hip ABduction/ADduction: AROM;Left;10 reps;Supine Straight Leg Raises: AROM;Left;10 reps;Supine Long Arc Quad: AROM;Left;5 reps;Seated Knee Flexion: AAROM;Left;5 reps;Seated Goniometric ROM: 5-60* AAROM L knee    General Comments        Pertinent Vitals/Pain Pain Score: 3  Pain Location: L knee  Pain Descriptors / Indicators: Sore;Operative site guarding Pain Intervention(s): Limited activity within patient's tolerance;Monitored during session;Ice applied(pt is avoiding narcotics 2* his daughter dying of OD)    Home Living                      Prior Function            PT Goals (current goals can now be found in the care plan section) Acute Rehab PT Goals Patient Stated Goal: go to gym  PT Goal Formulation: With patient Time For Goal Achievement: 12/17/17 Potential to Achieve Goals: Good Progress towards PT goals: Goals met/education completed, patient discharged from PT    Frequency    7X/week      PT Plan Current plan remains appropriate    Co-evaluation  AM-PAC PT "6 Clicks" Daily Activity  Outcome Measure  Difficulty turning over in bed (including adjusting bedclothes, sheets and blankets)?: A Little Difficulty moving from lying on back to sitting on the side of the bed? : A Little Difficulty sitting down on and standing up from a chair with arms (e.g., wheelchair, bedside commode, etc,.)?: A Little Help needed moving to and from a bed to chair (including a wheelchair)?: None Help needed walking in hospital room?: None Help needed climbing 3-5 steps with a railing? : A Little 6 Click Score: 20    End of Session Equipment  Utilized During Treatment: Gait belt Activity Tolerance: Patient tolerated treatment well Patient left: in chair;with chair alarm set;with call bell/phone within reach(with bone foam donned ) Nurse Communication: Mobility status PT Visit Diagnosis: Other abnormalities of gait and mobility (R26.89);Difficulty in walking, not elsewhere classified (R26.2)     Time: 6063-0160 PT Time Calculation (min) (ACUTE ONLY): 39 min  Charges:  $Gait Training: 8-22 mins $Therapeutic Exercise: 8-22 mins $Therapeutic Activity: 8-22 mins                     Blondell Reveal Kistler PT 12/04/2017  Acute Rehabilitation Services Pager [phone] Office [phone]

## 2017-12-04 NOTE — Progress Notes (Signed)
Patient discharged to home w/ family. Given all belongings, instructions, prescriptions, equipment at home. Verbalized understanding of all instructions. Escorted to pov via w/c.

## 2017-12-24 ENCOUNTER — Other Ambulatory Visit: Payer: Self-pay | Admitting: Orthopedic Surgery

## 2018-01-03 ENCOUNTER — Other Ambulatory Visit: Payer: Self-pay | Admitting: Orthopedic Surgery

## 2018-01-17 NOTE — Patient Instructions (Addendum)
Robert Petty  01/17/2018   Your procedure is scheduled on: 01-28-18    Report to [facility] Main  Entrance    Report to Admitting at 12:50 PM    Call this number if you have problems the morning of surgery [phone]    Remember: Do not eat food or drink liquids :After Midnight. You my have a Clear Liquid Diet from Midnight until 9:20 AM. After 9:20 AM, nothing until after surgery.    CLEAR LIQUID DIET   Foods Allowed                                                                     Foods Excluded  Coffee and tea, regular and decaf                             liquids that you cannot  Plain Jell-O in any flavor                                             see through such as: Fruit ices (not with fruit pulp)                                     milk, soups, orange juice  Iced Popsicles                                    All solid food Carbonated beverages, regular and diet                                    Cranberry, grape and apple juices Sports drinks like Gatorade Lightly seasoned clear broth or consume(fat free) Sugar, honey syrup  Sample Menu Breakfast                                Lunch                                     Supper Cranberry juice                    Beef broth                            Chicken broth Jell-O                                     Grape juice  Apple juice Coffee or tea                        Jell-O                                      Popsicle                                                Coffee or tea                        Coffee or tea  _____________________________________________________________________    BRUSH YOUR TEETH MORNING OF SURGERY AND RINSE YOUR MOUTH OUT, NO CHEWING GUM CANDY OR MINTS.     Take these medicines the morning of surgery with A SIP OF WATER: You may bring and use your eyedrops, and nasal spray as needed.                                You may not have  any metal on your body including hair pins and              piercings  Do not wear jewelry, lotions, powders, cologne or deodorant             Men may shave face and neck.   Do not bring valuables to the hospital.  IS NOT             RESPONSIBLE   FOR VALUABLES.  Contacts, dentures or bridgework may not be worn into surgery.  Leave suitcase in the car. After surgery it may be brought to your room.    :  Special Instructions: N/A              Please read over the following fact sheets you were given: _____________________________________________________________________  [facility] - Preparing for Surgery Before surgery, you can play an important role.  Because skin is not sterile, your skin needs to be as free of germs as possible.  You can reduce the number of germs on your skin by washing with CHG (chlorahexidine gluconate) soap before surgery.  CHG is an antiseptic cleaner which kills germs and bonds with the skin to continue killing germs even after washing. Please DO NOT use if you have an allergy to CHG or antibacterial soaps.  If your skin becomes reddened/irritated stop using the CHG and inform your nurse when you arrive at Short Stay. Do not shave (including legs and underarms) for at least 48 hours prior to the first CHG shower.  You may shave your face/neck. Please follow these instructions carefully:  1.  Shower with CHG Soap the night before surgery and the  morning of Surgery.  2.  If you choose to wash your hair, wash your hair first as usual with your  normal  shampoo.  3.  After you shampoo, rinse your hair and body thoroughly to remove the  shampoo.                           4.  Use CHG as you would any other liquid soap.  You can  apply chg directly  to the skin and wash                       Gently with a scrungie or clean washcloth.  5.  Apply the CHG Soap to your body ONLY FROM THE NECK DOWN.   Do not use on face/ open                           Wound or open  sores. Avoid contact with eyes, ears mouth and genitals (private parts).                       Wash face,  Genitals (private parts) with your normal soap.             6.  Wash thoroughly, paying special attention to the area where your surgery  will be performed.  7.  Thoroughly rinse your body with warm water from the neck down.  8.  DO NOT shower/wash with your normal soap after using and rinsing off  the CHG Soap.                9.  Pat yourself dry with a clean towel.            10.  Wear clean pajamas.            11.  Place clean sheets on your bed the night of your first shower and do not  sleep with pets. Day of Surgery : Do not apply any lotions/deodorants the morning of surgery.  Please wear clean clothes to the hospital/surgery center.  FAILURE TO FOLLOW THESE INSTRUCTIONS MAY RESULT IN THE CANCELLATION OF YOUR SURGERY PATIENT SIGNATURE_________________________________  NURSE SIGNATURE__________________________________  ________________________________________________________________________   Robert Petty  An incentive spirometer is a tool that can help keep your lungs clear and active. This tool measures how well you are filling your lungs with each breath. Taking long deep breaths may help reverse or decrease the chance of developing breathing (pulmonary) problems (especially infection) following:  A long period of time when you are unable to move or be active. BEFORE THE PROCEDURE   If the spirometer includes an indicator to show your best effort, your nurse or respiratory therapist will set it to a desired goal.  If possible, sit up straight or lean slightly forward. Try not to slouch.  Hold the incentive spirometer in an upright position. INSTRUCTIONS FOR USE  1. Sit on the edge of your bed if possible, or sit up as far as you can in bed or on a chair. 2. Hold the incentive spirometer in an upright position. 3. Breathe out normally. 4. Place the mouthpiece  in your mouth and seal your lips tightly around it. 5. Breathe in slowly and as deeply as possible, raising the piston or the ball toward the top of the column. 6. Hold your breath for 3-5 seconds or for as long as possible. Allow the piston or ball to fall to the bottom of the column. 7. Remove the mouthpiece from your mouth and breathe out normally. 8. Rest for a few seconds and repeat Steps 1 through 7 at least 10 times every 1-2 hours when you are awake. Take your time and take a few normal breaths between deep breaths. 9. The spirometer may include an indicator to show your best effort. Use the indicator as a goal to work toward during  each repetition. 10. After each set of 10 deep breaths, practice coughing to be sure your lungs are clear. If you have an incision (the cut made at the time of surgery), support your incision when coughing by placing a pillow or rolled up towels firmly against it. Once you are able to get out of bed, walk around indoors and cough well. You may stop using the incentive spirometer when instructed by your caregiver.  RISKS AND COMPLICATIONS  Take your time so you do not get dizzy or light-headed.  If you are in pain, you may need to take or ask for pain medication before doing incentive spirometry. It is harder to take a deep breath if you are having pain. AFTER USE  Rest and breathe slowly and easily.  It can be helpful to keep track of a log of your progress. Your caregiver can provide you with a simple table to help with this. If you are using the spirometer at home, follow these instructions: SEEK MEDICAL CARE IF:   You are having difficultly using the spirometer.  You have trouble using the spirometer as often as instructed.  Your pain medication is not giving enough relief while using the spirometer.  You develop fever of 100.5 F (38.1 C) or higher. SEEK IMMEDIATE MEDICAL CARE IF:   You cough up bloody sputum that had not been present  before.  You develop fever of 102 F (38.9 C) or greater.  You develop worsening pain at or near the incision site. MAKE SURE YOU:   Understand these instructions.  Will watch your condition.  Will get help right away if you are not doing well or get worse. Document Released: 07/03/2006 Document Revised: 05/15/2011 Document Reviewed: 09/03/2006 Exit[employer] Patient Information 2014 Exit[employer], [place].   ________________________________________________________________________  WHAT IS A BLOOD TRANSFUSION? Blood Transfusion Information  A transfusion is the replacement of blood or some of its parts. Blood is made up of multiple cells which provide different functions.  Red blood cells carry oxygen and are used for blood loss replacement.  White blood cells fight against infection.  Platelets control bleeding.  Plasma helps clot blood.  Other blood products are available for specialized needs, such as hemophilia or other clotting disorders. BEFORE THE TRANSFUSION  Who gives blood for transfusions?   Healthy volunteers who are fully evaluated to make sure their blood is safe. This is blood bank blood. Transfusion therapy is the safest it has ever been in the practice of medicine. Before blood is taken from a donor, a complete history is taken to make sure that person has no history of diseases nor engages in risky social behavior (examples are intravenous drug use or sexual activity with multiple partners). The donor's travel history is screened to minimize risk of transmitting infections, such as malaria. The donated blood is tested for signs of infectious diseases, such as HIV and hepatitis. The blood is then tested to be sure it is compatible with you in order to minimize the chance of a transfusion reaction. If you or a relative donates blood, this is often done in anticipation of surgery and is not appropriate for emergency situations. It takes many days to process the donated  blood. RISKS AND COMPLICATIONS Although transfusion therapy is very safe and saves many lives, the main dangers of transfusion include:   Getting an infectious disease.  Developing a transfusion reaction. This is an allergic reaction to something in the blood you were given. Every precaution is taken to prevent  this. The decision to have a blood transfusion has been considered carefully by your caregiver before blood is given. Blood is not given unless the benefits outweigh the risks. AFTER THE TRANSFUSION  Right after receiving a blood transfusion, you will usually feel much better and more energetic. This is especially true if your red blood cells have gotten low (anemic). The transfusion raises the level of the red blood cells which carry oxygen, and this usually causes an energy increase.  The nurse administering the transfusion will monitor you carefully for complications. HOME CARE INSTRUCTIONS  No special instructions are needed after a transfusion. You may find your energy is better. Speak with your caregiver about any limitations on activity for underlying diseases you may have. SEEK MEDICAL CARE IF:   Your condition is not improving after your transfusion.  You develop redness or irritation at the intravenous (IV) site. SEEK IMMEDIATE MEDICAL CARE IF:  Any of the following symptoms occur over the next 12 hours:  Shaking chills.  You have a temperature by mouth above 102 F (38.9 C), not controlled by medicine.  Chest, back, or muscle pain.  People around you feel you are not acting correctly or are confused.  Shortness of breath or difficulty breathing.  Dizziness and fainting.  You get a rash or develop hives.  You have a decrease in urine output.  Your urine turns a dark color or changes to pink, red, or brown. Any of the following symptoms occur over the next 10 days:  You have a temperature by mouth above 102 F (38.9 C), not controlled by  medicine.  Shortness of breath.  Weakness after normal activity.  The white part of the eye turns yellow (jaundice).  You have a decrease in the amount of urine or are urinating less often.  Your urine turns a dark color or changes to pink, red, or brown. Document Released: 02/18/2000 Document Revised: 05/15/2011 Document Reviewed: 10/07/2007 [facility] Patient Information 2014 [place], [place].  _______________________________________________________________________

## 2018-01-17 NOTE — Progress Notes (Addendum)
11-14-17 (Epic) EKG, CXR, LOV w/Cardiology to follow up prn  09-14-15 (Epic) Stress

## 2018-01-21 ENCOUNTER — Encounter (HOSPITAL_COMMUNITY): Payer: Self-pay

## 2018-01-21 ENCOUNTER — Encounter (HOSPITAL_COMMUNITY)
Admission: RE | Admit: 2018-01-21 | Discharge: 2018-01-21 | Disposition: A | Discharge status: Home / Self Care | Payer: Medicare Other | Source: Ambulatory Visit | Attending: Orthopedic Surgery | Admitting: Orthopedic Surgery

## 2018-01-21 ENCOUNTER — Encounter (INDEPENDENT_AMBULATORY_CARE_PROVIDER_SITE_OTHER): Payer: Self-pay

## 2018-01-21 ENCOUNTER — Other Ambulatory Visit: Payer: Self-pay

## 2018-01-21 DIAGNOSIS — M1711 Unilateral primary osteoarthritis, right knee: Secondary | ICD-10-CM | POA: Insufficient documentation

## 2018-01-21 DIAGNOSIS — Z01812 Encounter for preprocedural laboratory examination: Secondary | ICD-10-CM | POA: Diagnosis present

## 2018-01-21 LAB — URINALYSIS, ROUTINE W REFLEX MICROSCOPIC
BILIRUBIN URINE: NEGATIVE
GLUCOSE, UA: NEGATIVE mg/dL
HGB URINE DIPSTICK: NEGATIVE
Ketones, ur: NEGATIVE mg/dL
Leukocytes, UA: NEGATIVE
Nitrite: NEGATIVE
PH: 5 (ref 5.0–8.0)
Protein, ur: NEGATIVE mg/dL
SPECIFIC GRAVITY, URINE: 1.017 (ref 1.005–1.030)

## 2018-01-21 LAB — PROTIME-INR
INR: 1.05
Prothrombin Time: 13.6 seconds (ref 11.4–15.2)

## 2018-01-21 LAB — CBC WITH DIFFERENTIAL/PLATELET
Abs Immature Granulocytes: 0.04 10*3/uL (ref 0.00–0.07)
BASOS PCT: 1 %
Basophils Absolute: 0.1 10*3/uL (ref 0.0–0.1)
EOS ABS: 0.2 10*3/uL (ref 0.0–0.5)
EOS PCT: 3 %
HEMATOCRIT: 42.7 % (ref 39.0–52.0)
Hemoglobin: 13.4 g/dL (ref 13.0–17.0)
IMMATURE GRANULOCYTES: 1 %
LYMPHS ABS: 1.3 10*3/uL (ref 0.7–4.0)
Lymphocytes Relative: 19 %
MCH: 30.2 pg (ref 26.0–34.0)
MCHC: 31.4 g/dL (ref 30.0–36.0)
MCV: 96.2 fL (ref 80.0–100.0)
MONOS PCT: 8 %
Monocytes Absolute: 0.5 10*3/uL (ref 0.1–1.0)
Neutro Abs: 4.9 10*3/uL (ref 1.7–7.7)
Neutrophils Relative %: 68 %
PLATELETS: 265 10*3/uL (ref 150–400)
RBC: 4.44 MIL/uL (ref 4.22–5.81)
RDW: 12.6 % (ref 11.5–15.5)
WBC: 7.1 10*3/uL (ref 4.0–10.5)
nRBC: 0 % (ref 0.0–0.2)

## 2018-01-21 LAB — BASIC METABOLIC PANEL
Anion gap: 9 (ref 5–15)
BUN: 14 mg/dL (ref 8–23)
CALCIUM: 9.4 mg/dL (ref 8.9–10.3)
CO2: 29 mmol/L (ref 22–32)
CREATININE: 0.8 mg/dL (ref 0.61–1.24)
Chloride: 106 mmol/L (ref 98–111)
GLUCOSE: 96 mg/dL (ref 70–99)
Potassium: 4.2 mmol/L (ref 3.5–5.1)
Sodium: 144 mmol/L (ref 135–145)

## 2018-01-21 LAB — SURGICAL PCR SCREEN
MRSA, PCR: NEGATIVE
Staphylococcus aureus: NEGATIVE

## 2018-01-21 LAB — APTT: APTT: 28 s (ref 24–36)

## 2018-01-23 DIAGNOSIS — M1711 Unilateral primary osteoarthritis, right knee: Secondary | ICD-10-CM | POA: Diagnosis present

## 2018-01-23 NOTE — H&P (Signed)
TOTAL KNEE ADMISSION H&P  Patient is being admitted for right total knee arthroplasty.  Subjective:  Chief Complaint:right knee pain.  HPI: Robert Petty, 72 y.o. male, has a history of pain and functional disability in the right knee due to arthritis and has failed non-surgical conservative treatments for greater than 12 weeks to includeNSAID's and/or analgesics, corticosteriod injections, viscosupplementation injections, flexibility and strengthening excercises, use of assistive devices, weight reduction as appropriate and activity modification.  Onset of symptoms was gradual, starting several years ago with gradually worsening course since that time. The patient noted no past surgery on the right knee(s).  Patient currently rates pain in the right knee(s) at 10 out of 10 with activity. Patient has night pain, worsening of pain with activity and weight bearing, pain that interferes with activities of daily living, pain with passive range of motion, crepitus and joint swelling.  Patient has evidence of subchondral sclerosis and joint space narrowing by imaging studies.  There is no active infection.  Patient Active Problem List   Diagnosis Date Noted  . S/P total knee arthroplasty, left 12/03/2017  . Degenerative arthritis of left knee 11/30/2017  . Abnormal myocardial perfusion study 09/15/2015  . Hyperlipidemia 09/15/2015   Past Medical History:  Diagnosis Date  . Hyperlipidemia   . Kidney stones     Past Surgical History:  Procedure Laterality Date  . BACK SURGERY    . CARDIAC CATHETERIZATION N/A 09/22/2015   Procedure: Left Heart Cath and Coronary Angiography;  Surgeon: Tonny Bollman, MD;  Location: [facility] INVASIVE CV LAB;  Service: Cardiovascular;  Laterality: N/A;  . HERNIATED DISC REPAIR    . SHOULDER SURGERY    . TOTAL KNEE ARTHROPLASTY Left 12/03/2017   Procedure: LEFT TOTAL KNEE ARTHROPLASTY;  Surgeon: Gean Birchwood, MD;  Location: [facility] ORS;  Service: Orthopedics;  Laterality:  Left;    No current facility-administered medications for this encounter.    Current Outpatient Medications  Medication Sig Dispense Refill Last Dose  . aspirin EC 81 MG tablet Take 1 tablet (81 mg total) by mouth 2 (two) times daily. (Patient not taking: Reported on 01/21/2018) 60 tablet 0 Not Taking at Unknown time  . atorvastatin (LIPITOR) 40 MG tablet Take 1 tablet (40 mg total) by mouth daily. (Patient taking differently: Take 40 mg by mouth every evening. ) 90 tablet 3 12/02/2017 at Unknown time  . hydroxypropyl methylcellulose / hypromellose (ISOPTO TEARS / GONIOVISC) 2.5 % ophthalmic solution Place 1 drop into both eyes daily as needed for dry eyes.     . Multiple Vitamin (MULTIVITAMIN WITH MINERALS) TABS tablet Take 1 tablet by mouth daily.   12/02/2017 at Unknown time  . sodium chloride (OCEAN) 0.65 % SOLN nasal spray Place 1 spray into both nostrils daily.     Marland Kitchen oxyCODONE-acetaminophen (PERCOCET/ROXICET) 5-325 MG tablet Take 1 tablet by mouth every 4 (four) hours as needed for severe pain. (Patient not taking: Reported on 01/16/2018) 30 tablet 0 Not Taking at Unknown time  . tiZANidine (ZANAFLEX) 2 MG tablet Take 1 tablet (2 mg total) by mouth every 6 (six) hours as needed. (Patient not taking: Reported on 01/16/2018) 60 tablet 0 Not Taking at Unknown time   No Known Allergies  Social History   Tobacco Use  . Smoking status: Former Smoker    Last attempt to quit: 03/01/1977    Years since quitting: 40.9  . Smokeless tobacco: Never Used  Substance Use Topics  . Alcohol use: Yes    Comment: OCCASIONALLY  Family History  Problem Relation Age of Onset  . Alzheimer's disease Mother   . Hypertension Mother   . Hyperlipidemia Mother   . Prostate cancer Father   . Leukemia Sister   . Lung cancer Brother   . Brain cancer Brother   . Diabetes Brother   . Heart disease Brother   . Hyperlipidemia Brother   . Hypertension Brother   . Cancer Paternal Grandfather   . Hypertension  Sister   . Diabetes Sister   . Hyperlipidemia Sister   . Hypertension Sister   . Diabetes Sister   . Hyperlipidemia Sister   . Hypertension Sister   . Diabetes Sister   . Hyperlipidemia Sister   . Hyperlipidemia Brother   . Diabetes Brother   . Hyperlipidemia Brother   . Diabetes Brother   . Hyperlipidemia Brother   . Diabetes Brother      Review of Systems  Constitutional: Negative.   HENT: Negative.   Eyes: Negative.   Respiratory: Negative.   Cardiovascular: Negative.   Gastrointestinal: Negative.   Genitourinary:       ED  Musculoskeletal: Positive for joint pain.  Skin: Negative.   Neurological: Negative.   Endo/Heme/Allergies: Bruises/bleeds easily.  Psychiatric/Behavioral: Negative.     Objective:  Physical Exam  Constitutional: He is oriented to person, place, and time. He appears well-developed and well-nourished.  HENT:  Head: Normocephalic and atraumatic.  Neck: Normal range of motion. Neck supple.  Cardiovascular: Intact distal pulses.  Respiratory: Effort normal.  Musculoskeletal: He exhibits tenderness.  The right knee has varus deformity tender along the medial joint line.  One plus effusion, 5 flexion contracture, flexes 120.  Neurological: He is alert and oriented to person, place, and time.  Skin: Skin is warm and dry.  Psychiatric: He has a normal mood and affect. His behavior is normal. Judgment and thought content normal.    Vital signs in last 24 hours:    Labs:   Estimated body mass index is 33.19 kg/m as calculated from the following:   Height as of 01/21/18: 5\' 11"  (1.803 m).   Weight as of 01/21/18: 108 kg.   Imaging Review Plain radiographs demonstrate  AP, lateral and sunrise x-rays show well-placed well fixed DePuy attune prostheses, no evidence of loosening.  On the AP view the contralateral right knee is down to his last millimeter of cartilage.   Preoperative templating of the joint replacement has been completed,  documented, and submitted to the Operating Room personnel in order to optimize intra-operative equipment management.   Anticipated LOS equal to or greater than 2 midnights due to - Age 69 and older with one or more of the following:  - Obesity  - Expected need for hospital services (PT, OT, Nursing) required for safe  discharge     Assessment/Plan:  End stage arthritis, right knee   The patient history, physical examination, clinical judgment of the provider and imaging studies are consistent with end stage degenerative joint disease of the right knee(s) and total knee arthroplasty is deemed medically necessary. The treatment options including medical management, injection therapy arthroscopy and arthroplasty were discussed at length. The risks and benefits of total knee arthroplasty were presented and reviewed. The risks due to aseptic loosening, infection, stiffness, patella tracking problems, thromboembolic complications and other imponderables were discussed. The patient acknowledged the explanation, agreed to proceed with the plan and consent was signed. Patient is being admitted for inpatient treatment for surgery, pain control, PT, OT, prophylactic antibiotics, VTE  prophylaxis, progressive ambulation and ADL's and discharge planning. The patient is planning to be discharged home with home health services.

## 2018-01-25 NOTE — Progress Notes (Signed)
Pt made aware that his surgery time for his 01-28-18 has changed from 3:20 PM to 9:35 AM. Pt is to report to Admitting at 7:00 AM, and to remain NPO after Midnight. Pt verbalized understanding.

## 2018-01-27 MED ORDER — TRANEXAMIC ACID 1000 MG/10ML IV SOLN
2000.0000 mg | INTRAVENOUS | Status: DC
  Filled 2018-01-27: qty 20

## 2018-01-27 MED ORDER — BUPIVACAINE LIPOSOME 1.3 % IJ SUSP
20.0000 mL | INTRAMUSCULAR | Status: DC
  Filled 2018-01-27: qty 20

## 2018-01-28 ENCOUNTER — Ambulatory Visit (HOSPITAL_COMMUNITY)
Admission: RE | Admit: 2018-01-28 | Discharge: 2018-01-29 | Disposition: A | Discharge status: Home / Self Care | Payer: Medicare Other | Source: Ambulatory Visit | Attending: Orthopedic Surgery | Admitting: Orthopedic Surgery

## 2018-01-28 ENCOUNTER — Other Ambulatory Visit: Payer: Self-pay

## 2018-01-28 ENCOUNTER — Ambulatory Visit (HOSPITAL_COMMUNITY): Payer: Medicare Other | Admitting: Certified Registered Nurse Anesthetist

## 2018-01-28 ENCOUNTER — Encounter (HOSPITAL_COMMUNITY): Payer: Self-pay | Admitting: Certified Registered Nurse Anesthetist

## 2018-01-28 ENCOUNTER — Encounter (HOSPITAL_COMMUNITY)
Admission: RE | Disposition: A | Discharge status: Home / Self Care | Payer: Self-pay | Source: Ambulatory Visit | Attending: Orthopedic Surgery

## 2018-01-28 DIAGNOSIS — Z8042 Family history of malignant neoplasm of prostate: Secondary | ICD-10-CM | POA: Diagnosis not present

## 2018-01-28 DIAGNOSIS — Z8249 Family history of ischemic heart disease and other diseases of the circulatory system: Secondary | ICD-10-CM | POA: Diagnosis not present

## 2018-01-28 DIAGNOSIS — Z7982 Long term (current) use of aspirin: Secondary | ICD-10-CM | POA: Insufficient documentation

## 2018-01-28 DIAGNOSIS — Z6833 Body mass index (BMI) 33.0-33.9, adult: Secondary | ICD-10-CM | POA: Insufficient documentation

## 2018-01-28 DIAGNOSIS — E785 Hyperlipidemia, unspecified: Secondary | ICD-10-CM | POA: Insufficient documentation

## 2018-01-28 DIAGNOSIS — Z833 Family history of diabetes mellitus: Secondary | ICD-10-CM | POA: Diagnosis not present

## 2018-01-28 DIAGNOSIS — Z87891 Personal history of nicotine dependence: Secondary | ICD-10-CM | POA: Diagnosis not present

## 2018-01-28 DIAGNOSIS — Z808 Family history of malignant neoplasm of other organs or systems: Secondary | ICD-10-CM | POA: Diagnosis not present

## 2018-01-28 DIAGNOSIS — Z820 Family history of epilepsy and other diseases of the nervous system: Secondary | ICD-10-CM | POA: Insufficient documentation

## 2018-01-28 DIAGNOSIS — Z806 Family history of leukemia: Secondary | ICD-10-CM | POA: Diagnosis not present

## 2018-01-28 DIAGNOSIS — M1711 Unilateral primary osteoarthritis, right knee: Secondary | ICD-10-CM | POA: Insufficient documentation

## 2018-01-28 DIAGNOSIS — Z801 Family history of malignant neoplasm of trachea, bronchus and lung: Secondary | ICD-10-CM | POA: Insufficient documentation

## 2018-01-28 DIAGNOSIS — Z96652 Presence of left artificial knee joint: Secondary | ICD-10-CM | POA: Diagnosis not present

## 2018-01-28 DIAGNOSIS — Z79899 Other long term (current) drug therapy: Secondary | ICD-10-CM | POA: Diagnosis not present

## 2018-01-28 DIAGNOSIS — Z87442 Personal history of urinary calculi: Secondary | ICD-10-CM | POA: Diagnosis not present

## 2018-01-28 DIAGNOSIS — G8929 Other chronic pain: Secondary | ICD-10-CM | POA: Insufficient documentation

## 2018-01-28 DIAGNOSIS — E669 Obesity, unspecified: Secondary | ICD-10-CM | POA: Diagnosis not present

## 2018-01-28 HISTORY — PX: TOTAL KNEE ARTHROPLASTY: SHX125

## 2018-01-28 LAB — TYPE AND SCREEN
ABO/RH(D): O NEG
ANTIBODY SCREEN: NEGATIVE

## 2018-01-28 SURGERY — ARTHROPLASTY, KNEE, TOTAL
Anesthesia: Spinal | Site: Knee | Laterality: Right

## 2018-01-28 MED ORDER — ASPIRIN 81 MG PO CHEW
81.0000 mg | CHEWABLE_TABLET | Freq: Two times a day (BID) | ORAL | Status: DC
  Administered 2018-01-28 – 2018-01-29 (×2): 81 mg via ORAL
  Filled 2018-01-28 (×2): qty 1

## 2018-01-28 MED ORDER — TIZANIDINE HCL 2 MG PO TABS: 2.0000 mg | ORAL_TABLET | Freq: Four times a day (QID) | ORAL | 0 refills | Status: DC | PRN

## 2018-01-28 MED ORDER — LACTATED RINGERS IV SOLN
INTRAVENOUS | Status: DC
  Administered 2018-01-28 (×2): via INTRAVENOUS

## 2018-01-28 MED ORDER — SODIUM CHLORIDE (PF) 0.9 % IJ SOLN
INTRAMUSCULAR | Status: DC | PRN
  Administered 2018-01-28: 70 mL

## 2018-01-28 MED ORDER — METHOCARBAMOL 500 MG IVPB - SIMPLE MED
INTRAVENOUS | Status: AC
  Filled 2018-01-28: qty 50

## 2018-01-28 MED ORDER — PROPOFOL 10 MG/ML IV BOLUS
INTRAVENOUS | Status: AC
  Filled 2018-01-28: qty 40

## 2018-01-28 MED ORDER — WATER FOR IRRIGATION, STERILE IR SOLN
Status: DC | PRN
  Administered 2018-01-28: 2000 mL

## 2018-01-28 MED ORDER — DIPHENHYDRAMINE HCL 12.5 MG/5ML PO ELIX: 12.5000 mg | ORAL_SOLUTION | ORAL | Status: DC | PRN

## 2018-01-28 MED ORDER — BUPIVACAINE LIPOSOME 1.3 % IJ SUSP
INTRAMUSCULAR | Status: DC | PRN
  Administered 2018-01-28: 20 mL

## 2018-01-28 MED ORDER — SALINE SPRAY 0.65 % NA SOLN
1.0000 | Freq: Every day | NASAL | Status: DC
  Administered 2018-01-28: 1 via NASAL
  Filled 2018-01-28: qty 44

## 2018-01-28 MED ORDER — ASPIRIN EC 81 MG PO TBEC: 81.0000 mg | DELAYED_RELEASE_TABLET | Freq: Two times a day (BID) | ORAL | 0 refills | Status: DC

## 2018-01-28 MED ORDER — LIDOCAINE 2% (20 MG/ML) 5 ML SYRINGE
INTRAMUSCULAR | Status: DC | PRN
  Administered 2018-01-28: 40 mg via INTRAVENOUS

## 2018-01-28 MED ORDER — PANTOPRAZOLE SODIUM 40 MG PO TBEC
40.0000 mg | DELAYED_RELEASE_TABLET | Freq: Every day | ORAL | Status: DC
  Administered 2018-01-29: 40 mg via ORAL
  Filled 2018-01-28: qty 1

## 2018-01-28 MED ORDER — ROPIVACAINE HCL 7.5 MG/ML IJ SOLN
INTRAMUSCULAR | Status: DC | PRN
  Administered 2018-01-28: 20 mL via PERINEURAL

## 2018-01-28 MED ORDER — MIDAZOLAM HCL 2 MG/2ML IJ SOLN
1.0000 mg | INTRAMUSCULAR | Status: DC
  Administered 2018-01-28: 1 mg via INTRAVENOUS
  Filled 2018-01-28: qty 2

## 2018-01-28 MED ORDER — METHOCARBAMOL 500 MG PO TABS: 500.0000 mg | ORAL_TABLET | Freq: Four times a day (QID) | ORAL | Status: DC | PRN

## 2018-01-28 MED ORDER — LIDOCAINE 2% (20 MG/ML) 5 ML SYRINGE
INTRAMUSCULAR | Status: AC
  Filled 2018-01-28: qty 5

## 2018-01-28 MED ORDER — FENTANYL CITRATE (PF) 100 MCG/2ML IJ SOLN: 25.0000 ug | INTRAMUSCULAR | Status: DC | PRN

## 2018-01-28 MED ORDER — ONDANSETRON HCL 4 MG/2ML IJ SOLN
INTRAMUSCULAR | Status: AC
  Filled 2018-01-28: qty 2

## 2018-01-28 MED ORDER — DOCUSATE SODIUM 100 MG PO CAPS
100.0000 mg | ORAL_CAPSULE | Freq: Two times a day (BID) | ORAL | Status: DC
  Administered 2018-01-28 – 2018-01-29 (×2): 100 mg via ORAL
  Filled 2018-01-28 (×2): qty 1

## 2018-01-28 MED ORDER — SODIUM CHLORIDE (PF) 0.9 % IJ SOLN
INTRAMUSCULAR | Status: AC
  Filled 2018-01-28: qty 50

## 2018-01-28 MED ORDER — SODIUM CHLORIDE (PF) 0.9 % IJ SOLN
INTRAMUSCULAR | Status: AC
  Filled 2018-01-28: qty 20

## 2018-01-28 MED ORDER — DEXAMETHASONE SODIUM PHOSPHATE 10 MG/ML IJ SOLN
INTRAMUSCULAR | Status: AC
  Filled 2018-01-28: qty 1

## 2018-01-28 MED ORDER — BUPIVACAINE IN DEXTROSE 0.75-8.25 % IT SOLN
INTRATHECAL | Status: DC | PRN
  Administered 2018-01-28: 1.6 mL via INTRATHECAL

## 2018-01-28 MED ORDER — FENTANYL CITRATE (PF) 100 MCG/2ML IJ SOLN
50.0000 ug | INTRAMUSCULAR | Status: DC
  Administered 2018-01-28: 50 ug via INTRAVENOUS
  Filled 2018-01-28: qty 2

## 2018-01-28 MED ORDER — POLYETHYLENE GLYCOL 3350 17 G PO PACK: 17.0000 g | PACK | Freq: Every day | ORAL | Status: DC | PRN

## 2018-01-28 MED ORDER — ONDANSETRON HCL 4 MG/2ML IJ SOLN: 4.0000 mg | Freq: Four times a day (QID) | INTRAMUSCULAR | Status: DC | PRN

## 2018-01-28 MED ORDER — ATORVASTATIN CALCIUM 40 MG PO TABS
40.0000 mg | ORAL_TABLET | Freq: Every evening | ORAL | Status: DC
  Administered 2018-01-28: 40 mg via ORAL
  Filled 2018-01-28: qty 1

## 2018-01-28 MED ORDER — BISACODYL 5 MG PO TBEC: 5.0000 mg | DELAYED_RELEASE_TABLET | Freq: Every day | ORAL | Status: DC | PRN

## 2018-01-28 MED ORDER — MEPERIDINE HCL 50 MG/ML IJ SOLN: 6.2500 mg | INTRAMUSCULAR | Status: DC | PRN

## 2018-01-28 MED ORDER — ONDANSETRON HCL 4 MG PO TABS: 4.0000 mg | ORAL_TABLET | Freq: Four times a day (QID) | ORAL | Status: DC | PRN

## 2018-01-28 MED ORDER — BUPIVACAINE-EPINEPHRINE (PF) 0.25% -1:200000 IJ SOLN
INTRAMUSCULAR | Status: AC
  Filled 2018-01-28: qty 30

## 2018-01-28 MED ORDER — OXYCODONE HCL 5 MG PO TABS: 5.0000 mg | ORAL_TABLET | ORAL | Status: DC | PRN

## 2018-01-28 MED ORDER — CHLORHEXIDINE GLUCONATE 4 % EX LIQD: 60.0000 mL | Freq: Once | CUTANEOUS | Status: DC

## 2018-01-28 MED ORDER — PROPOFOL 10 MG/ML IV BOLUS
INTRAVENOUS | Status: DC | PRN
  Administered 2018-01-28: 30 mg via INTRAVENOUS
  Administered 2018-01-28: 20 mg via INTRAVENOUS

## 2018-01-28 MED ORDER — OXYCODONE-ACETAMINOPHEN 5-325 MG PO TABS: 1.0000 | ORAL_TABLET | ORAL | 0 refills | Status: DC | PRN

## 2018-01-28 MED ORDER — HYPROMELLOSE (GONIOSCOPIC) 2.5 % OP SOLN: 1.0000 [drp] | Freq: Every day | OPHTHALMIC | Status: DC | PRN

## 2018-01-28 MED ORDER — PHENOL 1.4 % MT LIQD: 1.0000 | OROMUCOSAL | Status: DC | PRN

## 2018-01-28 MED ORDER — PROPOFOL 500 MG/50ML IV EMUL
INTRAVENOUS | Status: DC | PRN
  Administered 2018-01-28: 100 ug/kg/min via INTRAVENOUS

## 2018-01-28 MED ORDER — BUPIVACAINE LIPOSOME 1.3 % IJ SUSP
20.0000 mL | Freq: Once | INTRAMUSCULAR | Status: DC
  Filled 2018-01-28: qty 20

## 2018-01-28 MED ORDER — BUPIVACAINE-EPINEPHRINE (PF) 0.25% -1:200000 IJ SOLN
INTRAMUSCULAR | Status: DC | PRN
  Administered 2018-01-28: 30 mL

## 2018-01-28 MED ORDER — SODIUM CHLORIDE 0.9 % IR SOLN
Status: DC | PRN
  Administered 2018-01-28: 1000 mL

## 2018-01-28 MED ORDER — CEFAZOLIN SODIUM-DEXTROSE 2-4 GM/100ML-% IV SOLN
2.0000 g | INTRAVENOUS | Status: AC
  Administered 2018-01-28: 2 g via INTRAVENOUS
  Filled 2018-01-28: qty 100

## 2018-01-28 MED ORDER — 0.9 % SODIUM CHLORIDE (POUR BTL) OPTIME
TOPICAL | Status: DC | PRN
  Administered 2018-01-28: 1000 mL

## 2018-01-28 MED ORDER — HYDROMORPHONE HCL 1 MG/ML IJ SOLN: 0.5000 mg | INTRAMUSCULAR | Status: DC | PRN

## 2018-01-28 MED ORDER — TRANEXAMIC ACID-NACL 1000-0.7 MG/100ML-% IV SOLN
1000.0000 mg | Freq: Once | INTRAVENOUS | Status: AC
  Administered 2018-01-28: 1000 mg via INTRAVENOUS
  Filled 2018-01-28: qty 100

## 2018-01-28 MED ORDER — METHOCARBAMOL 500 MG IVPB - SIMPLE MED
500.0000 mg | Freq: Four times a day (QID) | INTRAVENOUS | Status: DC | PRN
  Administered 2018-01-28: 500 mg via INTRAVENOUS
  Filled 2018-01-28: qty 50

## 2018-01-28 MED ORDER — GABAPENTIN 300 MG PO CAPS
300.0000 mg | ORAL_CAPSULE | Freq: Three times a day (TID) | ORAL | Status: DC
  Administered 2018-01-28 – 2018-01-29 (×3): 300 mg via ORAL
  Filled 2018-01-28 (×3): qty 1

## 2018-01-28 MED ORDER — METOCLOPRAMIDE HCL 5 MG PO TABS: 5.0000 mg | ORAL_TABLET | Freq: Three times a day (TID) | ORAL | Status: DC | PRN

## 2018-01-28 MED ORDER — ONDANSETRON HCL 4 MG/2ML IJ SOLN
INTRAMUSCULAR | Status: DC | PRN
  Administered 2018-01-28: 4 mg via INTRAVENOUS

## 2018-01-28 MED ORDER — ALUM & MAG HYDROXIDE-SIMETH 200-200-20 MG/5ML PO SUSP: 30.0000 mL | ORAL | Status: DC | PRN

## 2018-01-28 MED ORDER — FLEET ENEMA 7-19 GM/118ML RE ENEM: 1.0000 | ENEMA | Freq: Once | RECTAL | Status: DC | PRN

## 2018-01-28 MED ORDER — KCL IN DEXTROSE-NACL 20-5-0.45 MEQ/L-%-% IV SOLN
INTRAVENOUS | Status: DC
  Administered 2018-01-28 (×2): via INTRAVENOUS
  Filled 2018-01-28 (×3): qty 1000

## 2018-01-28 MED ORDER — ACETAMINOPHEN 325 MG PO TABS: 325.0000 mg | ORAL_TABLET | Freq: Four times a day (QID) | ORAL | Status: DC | PRN

## 2018-01-28 MED ORDER — POLYVINYL ALCOHOL 1.4 % OP SOLN: 1.0000 [drp] | OPHTHALMIC | Status: DC | PRN

## 2018-01-28 MED ORDER — TRANEXAMIC ACID-NACL 1000-0.7 MG/100ML-% IV SOLN
1000.0000 mg | INTRAVENOUS | Status: AC
  Administered 2018-01-28: 1000 mg via INTRAVENOUS

## 2018-01-28 MED ORDER — PROPOFOL 10 MG/ML IV BOLUS
INTRAVENOUS | Status: AC
  Filled 2018-01-28: qty 60

## 2018-01-28 MED ORDER — MENTHOL 3 MG MT LOZG: 1.0000 | LOZENGE | OROMUCOSAL | Status: DC | PRN

## 2018-01-28 MED ORDER — METOCLOPRAMIDE HCL 5 MG/ML IJ SOLN: 5.0000 mg | Freq: Three times a day (TID) | INTRAMUSCULAR | Status: DC | PRN

## 2018-01-28 MED ORDER — CELECOXIB 200 MG PO CAPS
200.0000 mg | ORAL_CAPSULE | Freq: Two times a day (BID) | ORAL | Status: DC
  Administered 2018-01-28 – 2018-01-29 (×2): 200 mg via ORAL
  Filled 2018-01-28 (×2): qty 1

## 2018-01-28 MED ORDER — TRANEXAMIC ACID 1000 MG/10ML IV SOLN
INTRAVENOUS | Status: DC | PRN
  Administered 2018-01-28: 2000 mg via TOPICAL

## 2018-01-28 MED ORDER — METOCLOPRAMIDE HCL 5 MG/ML IJ SOLN: 10.0000 mg | Freq: Once | INTRAMUSCULAR | Status: DC | PRN

## 2018-01-28 MED ORDER — TRANEXAMIC ACID-NACL 1000-0.7 MG/100ML-% IV SOLN
1000.0000 mg | INTRAVENOUS | Status: DC
  Filled 2018-01-28 (×2): qty 100

## 2018-01-28 MED ORDER — DEXAMETHASONE SODIUM PHOSPHATE 10 MG/ML IJ SOLN
INTRAMUSCULAR | Status: DC | PRN
  Administered 2018-01-28: 10 mg via INTRAVENOUS

## 2018-01-28 SURGICAL SUPPLY — 51 items
ATTUNE MED DOME PAT 41 KNEE (Knees) ×2 IMPLANT
ATTUNE MED DOME PAT 41MM KNEE (Knees) ×1 IMPLANT
ATTUNE PS FEM RT SZ 8 CEM KNEE (Femur) ×3 IMPLANT
ATTUNE PSRP INSR SZ8 5 KNEE (Insert) ×2 IMPLANT
ATTUNE PSRP INSR SZ8 5MM KNEE (Insert) ×1 IMPLANT
BAG DECANTER FOR FLEXI CONT (MISCELLANEOUS) ×3 IMPLANT
BAG ZIPLOCK 12X15 (MISCELLANEOUS) ×3 IMPLANT
BASE TIBIAL ATTUNE KNEE SZ9 (Knees) ×1 IMPLANT
BLADE SAG 18X100X1.27 (BLADE) ×3 IMPLANT
BLADE SAW SGTL 11.0X1.19X90.0M (BLADE) ×3 IMPLANT
BNDG ELASTIC 6X10 VLCR STRL LF (GAUZE/BANDAGES/DRESSINGS) ×3 IMPLANT
BOWL SMART MIX CTS (DISPOSABLE) ×3 IMPLANT
CEMENT HV SMART SET (Cement) ×6 IMPLANT
COVER SURGICAL LIGHT HANDLE (MISCELLANEOUS) ×3 IMPLANT
COVER WAND RF STERILE (DRAPES) ×3 IMPLANT
CUFF TOURN SGL QUICK 34 (TOURNIQUET CUFF) ×2
CUFF TRNQT CYL 34X4X40X1 (TOURNIQUET CUFF) ×1 IMPLANT
DECANTER SPIKE VIAL GLASS SM (MISCELLANEOUS) ×9 IMPLANT
DRAPE U-SHAPE 47X51 STRL (DRAPES) ×3 IMPLANT
DRSG AQUACEL AG ADV 3.5X10 (GAUZE/BANDAGES/DRESSINGS) ×3 IMPLANT
DURAPREP 26ML APPLICATOR (WOUND CARE) ×3 IMPLANT
ELECT REM PT RETURN 15FT ADLT (MISCELLANEOUS) ×3 IMPLANT
GLOVE BIO SURGEON STRL SZ7.5 (GLOVE) ×3 IMPLANT
GLOVE BIO SURGEON STRL SZ8.5 (GLOVE) ×3 IMPLANT
GLOVE BIOGEL PI IND STRL 8 (GLOVE) ×1 IMPLANT
GLOVE BIOGEL PI IND STRL 9 (GLOVE) ×1 IMPLANT
GLOVE BIOGEL PI INDICATOR 8 (GLOVE) ×2
GLOVE BIOGEL PI INDICATOR 9 (GLOVE) ×2
GOWN STRL REUS W/TWL XL LVL3 (GOWN DISPOSABLE) ×6 IMPLANT
HANDPIECE INTERPULSE COAX TIP (DISPOSABLE) ×2
HOOD PEEL AWAY FLYTE STAYCOOL (MISCELLANEOUS) ×9 IMPLANT
NEEDLE HYPO 21X1.5 SAFETY (NEEDLE) ×6 IMPLANT
NS IRRIG 1000ML POUR BTL (IV SOLUTION) ×3 IMPLANT
PACK ICE MAXI GEL EZY WRAP (MISCELLANEOUS) ×3 IMPLANT
PACK TOTAL KNEE CUSTOM (KITS) ×3 IMPLANT
PIN STEINMAN FIXATION KNEE (PIN) ×3 IMPLANT
PROTECTOR NERVE ULNAR (MISCELLANEOUS) ×3 IMPLANT
SET HNDPC FAN SPRY TIP SCT (DISPOSABLE) ×1 IMPLANT
STAPLER VISISTAT 35W (STAPLE) IMPLANT
SUT VIC AB 1 CTX 36 (SUTURE) ×2
SUT VIC AB 1 CTX36XBRD ANBCTR (SUTURE) ×1 IMPLANT
SUT VIC AB 2-0 CT1 27 (SUTURE) ×2
SUT VIC AB 2-0 CT1 TAPERPNT 27 (SUTURE) ×1 IMPLANT
SUT VIC AB 3-0 CT1 27 (SUTURE) ×2
SUT VIC AB 3-0 CT1 TAPERPNT 27 (SUTURE) ×1 IMPLANT
SYR CONTROL 10ML LL (SYRINGE) ×6 IMPLANT
TIBIAL BASE ATTUNE KNEE SZ9 (Knees) ×3 IMPLANT
TRAY FOLEY MTR SLVR 16FR STAT (SET/KITS/TRAYS/PACK) ×3 IMPLANT
WATER STERILE IRR 1000ML POUR (IV SOLUTION) ×6 IMPLANT
WRAP KNEE MAXI GEL POST OP (GAUZE/BANDAGES/DRESSINGS) ×3 IMPLANT
YANKAUER SUCT BULB TIP 10FT TU (MISCELLANEOUS) ×3 IMPLANT

## 2018-01-28 NOTE — Progress Notes (Signed)
AssistedDr. Carignan with right, ultrasound guided, adductor canal block. Side rails up, monitors on throughout procedure. See vital signs in flow sheet. Tolerated Procedure well.  

## 2018-01-28 NOTE — Anesthesia Postprocedure Evaluation (Signed)
Anesthesia Post Note  Patient: Robert Petty  Procedure(s) Performed: TOTAL KNEE ARTHROPLASTY (Right Knee)     Patient location during evaluation: PACU Anesthesia Type: Spinal Level of consciousness: awake and alert Pain management: pain level controlled Vital Signs Assessment: post-procedure vital signs reviewed and stable Respiratory status: spontaneous breathing and respiratory function stable Cardiovascular status: blood pressure returned to baseline and stable Postop Assessment: no headache, no backache, spinal receding and no apparent nausea or vomiting Anesthetic complications: no    Last Vitals:  Vitals:   01/28/18 1130 01/28/18 1145  BP: 116/69   Pulse: (!) 55   Resp: 13 12  Temp:    SpO2: 100% 100%    Last Pain:  Vitals:   01/28/18 1145  TempSrc:   PainSc: 0-No pain                 Phillips Groutarignan, Truong Delcastillo

## 2018-01-28 NOTE — Transfer of Care (Signed)
Immediate Anesthesia Transfer of Care Note  Patient: Robert Petty  Procedure(s) Performed: TOTAL KNEE ARTHROPLASTY (Right Knee)  Patient Location: PACU  Anesthesia Type:Regional and Spinal  Level of Consciousness: sedated  Airway & Oxygen Therapy: Patient Spontanous Breathing and Patient connected to face mask oxygen  Post-op Assessment: Report given to RN and Post -op Vital signs reviewed and stable  Post vital signs: Reviewed and stable  Last Vitals:  Vitals Value Taken Time  BP    Temp    Pulse 62 01/28/2018 11:21 AM  Resp 16 01/28/2018 11:21 AM  SpO2 97 % 01/28/2018 11:21 AM  Vitals shown include unvalidated device data.  Last Pain:  Vitals:   01/28/18 0735  TempSrc:   PainSc: 0-No pain      Patients Stated Pain Goal: 4 (01/28/18 0735)  Complications: No apparent anesthesia complications

## 2018-01-28 NOTE — Anesthesia Procedure Notes (Addendum)
Anesthesia Regional Block: Adductor canal block   Pre-Anesthetic Checklist: ,, timeout performed, Correct Patient, Correct Site, Correct Laterality, Correct Procedure, Correct Position, site marked, Risks and benefits discussed,  Surgical consent,  Pre-op evaluation,  At surgeon's request and post-op pain management  Laterality: Right and Lower  Prep: Maximum Sterile Barrier Precautions used, chloraprep       Needles:  Injection technique: Single-shot  Needle Type: Echogenic Stimulator Needle     Needle Length: 10cm      Additional Needles:   Procedures:,,,, ultrasound used (permanent image in chart),,,,  Narrative:  Start time: 01/28/2018 8:35 AM End time: 01/28/2018 8:42 AM Injection made incrementally with aspirations every 5 mL.  Performed by: Personally  Anesthesiologist: Phillips Groutarignan, Domitila Stetler, MD  Additional Notes: Risks, benefits and alternative to block explained extensively.  Patient tolerated procedure well, without complications.

## 2018-01-28 NOTE — Op Note (Signed)
PATIENT ID:      Robert Petty  MRN:     [id number] DOB/AGE:    09/20/1945 / 72 y.o.       OPERATIVE REPORT    DATE OF PROCEDURE:  01/28/2018       PREOPERATIVE DIAGNOSIS:   RIGHT KNEE OSTEOARTHRITIS      Estimated body mass index is 33.19 kg/m as calculated from the following:   Height as of this encounter: 5\' 11"  (1.803 m).   Weight as of this encounter: 108 kg.                                                        POSTOPERATIVE DIAGNOSIS:   RIGHT KNEE OSTEOARTHRITIS                                                                      PROCEDURE:  Procedure(s): TOTAL KNEE ARTHROPLASTY Using DepuyAttune RP implants #8R Femur, #9Tibia, 5 mm Attune RP bearing, 41 Patella     SURGEON: Nestor Lewandowsky    ASSISTANT:   Tomi Likens. [employer]   (Present and scrubbed throughout the case, critical for assistance with exposure, retraction, instrumentation, and closure.)         ANESTHESIA: Spinal, 20cc Exparel, 50cc 0.25% Marcaine  EBL: 250cc cc  FLUID REPLACEMENT: 1600 cc crystaloid  Tourniquet Time: None  Drains: None  Tranexamic Acid: 1gm IV, 2gm topical  Exparel: 266mg    COMPLICATIONS:  None         INDICATIONS FOR PROCEDURE: The patient has  RIGHT KNEE OSTEOARTHRITIS, Var deformities, XR shows bone on bone arthritis, lateral subluxation of tibia. Patient has failed all conservative measures including anti-inflammatory medicines, narcotics, attempts at exercise and weight loss, cortisone injections and viscosupplementation.  Risks and benefits of surgery have been discussed, questions answered.   DESCRIPTION OF PROCEDURE: The patient identified by armband, received  IV antibiotics, in the holding area at [facility]. Patient taken to the operating room, appropriate anesthetic monitors were attached, and Spinal anesthesia was  induced. IV Tranexamic acid was given.Tourniquet applied high to the operative thigh. Lateral post and foot positioner applied to the table, the lower  extremity was then prepped and draped in usual sterile fashion from the toes to the tourniquet. Time-out procedure was performed. The skin and subcutaneous tissue along the incision was injected with 20 cc of a mixture of Exparel and Marcaine solution, using a 20-gauge by 1-1/2 inch needle. We began the operation, with the knee flexed 130 degrees, by making the anterior midline incision starting at handbreadth above the patella going over the patella 1 cm medial to and 4 cm distal to the tibial tubercle. Small bleeders in the skin and the subcutaneous tissue identified and cauterized. Transverse retinaculum was incised and reflected medially and a medial parapatellar arthrotomy was accomplished. the patella was everted and theprepatellar fat pad resected. The superficial medial collateral ligament was then elevated from anterior to posterior along the proximal flare of the tibia and anterior half of the menisci resected. The knee was hyperflexed exposing bone  on bone arthritis. Peripheral and notch osteophytes as well as the cruciate ligaments were then resected. We continued to work our way around posteriorly along the proximal tibia, and externally rotated the tibia subluxing it out from underneath the femur. A McHale retractor was placed through the notch and a lateral Hohmann retractor placed, and we then drilled through the proximal tibia in line with the axis of the tibia followed by an intramedullary guide rod and 2-degree posterior slope cutting guide. The tibial cutting guide, 4 degree posterior sloped, was pinned into place allowing resection of 1 mm of bone medially and 10 mm of bone laterally. Satisfied with the tibial resection, we then entered the distal femur 2 mm anterior to the PCL origin with the intramedullary guide rod and applied the distal femoral cutting guide set at 9 mm, with 5 degrees of valgus. This was pinned along the epicondylar axis. At this point, the distal femoral cut was  accomplished without difficulty. We then sized for a #8R femoral component and pinned the guide in 3 degrees of external rotation. The chamfer cutting guide was pinned into place. The anterior, posterior, and chamfer cuts were accomplished without difficulty followed by the Attune RP box cutting guide and the box cut. We also removed posterior osteophytes from the posterior femoral condyles. The posterior capsule was injected with Exparel solution. The knee was brought into full extension. We checked our extension gap and fit a 5 mm bearing. Distracting in extension with a lamina spreader,  bleeders in the posterior capsule, Posterior medial and posterior lateral right down and cauterized.  The transexamic acid-soaked sponge was then placed in the gap of the knee and extension. The knee was flexed 30. The posterior patella cut was accomplished with the 9.5 mm Attune cutting guide, sized for a 41mm dome, and the fixation pegs drilled.The knee was then once again hyperflexed exposing the proximal tibia. We sized for a # 9 tibial base plate, applied the smokestack and the conical reamer followed by the the Delta fin keel punch. We then hammered into place the Attune RP trial femoral component, drilled the lugs, inserted a  5 mm trial bearing, trial patellar button, and took the knee through range of motion from 0-130 degrees. Medial and lateral ligamentous stability was checked. No thumb pressure was required for patellar Tracking. The tourniquet was not used. All trial components were removed, mating surfaces irrigated with pulse lavage, and dried with suction and sponges. 10 cc of the Exparel solution was applied to the cancellus bone of the patella distal femur and proximal tibia.  After waiting 30 seconds, the bony surfaces were again, dried with sponges. A double batch of DePuy HV cement was mixed and applied to all bony metallic mating surfaces except for the posterior condyles of the femur itself. In order, we  hammered into place the tibial tray and removed excess cement, the femoral component and removed excess cement. The final Attune RP bearing was inserted, and the knee brought to full extension with compression. The patellar button was clamped into place, and excess cement removed. The knee was held at 30 flexion with compression, while the cement cured. The wound was irrigated out with normal saline solution pulse lavage. The rest of the Exparel was injected into the parapatellar arthrotomy, subcutaneous tissues, and periosteal tissues. The parapatellar arthrotomy was closed with running #1 Vicryl suture. The subcutaneous tissue with 0 and 2-0 undyed Vicryl suture, and the skin with running 3-0 SQ vicryl. An Aquacil and  Ace wrap were applied. The patient was taken to recovery room without difficulty.   Nestor Lewandowsky 01/28/2018, 11:36 AM

## 2018-01-28 NOTE — Evaluation (Signed)
Physical Therapy Evaluation Patient Details Name: Robert Petty MRN: [id number] DOB: [birth year] Today's Date: 01/28/2018   History of Present Illness  Pt is a 72 YO male s/p R TKR on 01/28/18. 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   Clinical Impression  Patient presents with decreased independence with mobility and will benefit from skilled PT in the acute setting to maximize mobility for d/c home with follow up PT.      Follow Up Recommendations Follow surgeon's recommendation for DC plan and follow-up therapies    Equipment Recommendations  None recommended by PT    Recommendations for Other Services       Precautions / Restrictions Precautions Precautions: Fall;Knee Restrictions Other Position/Activity Restrictions: WBAT       Mobility  Bed Mobility   Bed Mobility: Supine to Sit     Supine to sit: Supervision        Transfers Overall transfer level: Needs assistance Equipment used: Rolling walker (2 wheeled) Transfers: Sit to/from Stand Sit to Stand: Min guard         General transfer comment: cues for hand placement  Ambulation/Gait Ambulation/Gait assistance: Min guard Gait Distance (Feet): 85 Feet Assistive device: Rolling walker (2 wheeled) Gait Pattern/deviations: Step-through pattern;Step-to pattern;Antalgic     General Gait Details: reports mild knee buckling in stance on R; cues for shorter step length to prevent buckling  Stairs            Wheelchair Mobility    Modified Rankin (Stroke Patients Only)       Balance Overall balance assessment: Needs assistance   Sitting balance-Leahy Scale: Good       Standing balance-Leahy Scale: Fair                               Pertinent Vitals/Pain Pain Assessment: No/denies pain    Home Living Family/patient expects to be discharged to:: Private residence Living Arrangements: Spouse/significant other Available  Help at Discharge: Family;Available PRN/intermittently Type of Home: House Home Access: Stairs to enter Entrance Stairs-Rails: None Entrance Stairs-Number of Steps: 2  Home Layout: Two level;Able to live on main level with bedroom/bathroom Home Equipment: Dan Humphreys - 2 wheels;Cane - single point      Prior Function Level of Independence: Independent               Hand Dominance   Dominant Hand: Right    Extremity/Trunk Assessment   Upper Extremity Assessment Upper Extremity Assessment: Overall WFL for tasks assessed    Lower Extremity Assessment Lower Extremity Assessment: RLE deficits/detail RLE Deficits / Details: AROM grossly 0-70, positive SLR    Cervical / Trunk Assessment Cervical / Trunk Assessment: Normal  Communication   Communication: No difficulties  Cognition Arousal/Alertness: Awake/alert Behavior During Therapy: WFL for tasks assessed/performed Overall Cognitive Status: Within Functional Limits for tasks assessed                                        General Comments      Exercises Total Joint Exercises Ankle Circles/Pumps: AROM;Both;Supine;10 reps Quad Sets: AROM;5 reps;Supine;Right Short Arc Quad: AROM;Supine;5 reps;Right Heel Slides: Supine;5 reps;AROM;Right Hip ABduction/ADduction: AROM;Supine;5 reps;Right Straight Leg Raises: AROM;Supine;5 reps;Right Goniometric ROM: 0-70   Assessment/Plan    PT Assessment Patient needs continued PT services  PT Problem List Decreased strength;Decreased range  of motion;Decreased mobility;Decreased knowledge of precautions;Decreased knowledge of use of DME       PT Treatment Interventions DME instruction;Therapeutic activities;Gait training;Therapeutic exercise;Patient/family education;Stair training    PT Goals (Current goals can be found in the Care Plan section)  Acute Rehab PT Goals Patient Stated Goal: golf PT Goal Formulation: With patient Time For Goal Achievement:  01/31/18 Potential to Achieve Goals: Good    Frequency 7X/week   Barriers to discharge        Co-evaluation               AM-PAC PT "6 Clicks" Mobility  Outcome Measure Help needed turning from your back to your side while in a flat bed without using bedrails?: A Little Help needed moving from lying on your back to sitting on the side of a flat bed without using bedrails?: A Little Help needed moving to and from a bed to a chair (including a wheelchair)?: A Little Help needed standing up from a chair using your arms (e.g., wheelchair or bedside chair)?: A Little Help needed to walk in hospital room?: A Little Help needed climbing 3-5 steps with a railing? : A Little 6 Click Score: 18    End of Session Equipment Utilized During Treatment: Gait belt Activity Tolerance: Patient tolerated treatment well Patient left: with call bell/phone within reach;in chair;with chair alarm set Nurse Communication: Mobility status PT Visit Diagnosis: Difficulty in walking, not elsewhere classified (R26.2);Pain Pain - Right/Left: Right Pain - part of body: Knee    Time: 1535-1559 PT Time Calculation (min) (ACUTE ONLY): 24 min   Charges:   PT Evaluation $PT Eval Low Complexity: 1 Low PT Treatments $Gait Training: 8-22 mins        Robert LawlessCyndi Jakarri Petty, [place]PT Acute Rehabilitation Services [phone] 01/28/2018   Robert Petty 01/28/2018, 4:44 PM

## 2018-01-28 NOTE — Anesthesia Preprocedure Evaluation (Signed)
Anesthesia Evaluation  Patient identified by MRN, date of birth, ID band Patient awake    Reviewed: Allergy & Precautions, NPO status , Patient's Chart, lab work & pertinent test results  Airway Mallampati: II  TM Distance: >3 FB Neck ROM: Full    Dental no notable dental hx.    Pulmonary neg pulmonary ROS, former smoker,    Pulmonary exam normal breath sounds clear to auscultation       Cardiovascular negative cardio ROS Normal cardiovascular exam Rhythm:Regular Rate:Normal     Neuro/Psych negative neurological ROS  negative psych ROS   GI/Hepatic negative GI ROS, Neg liver ROS,   Endo/Other  obesity  Renal/GU negative Renal ROS  negative genitourinary   Musculoskeletal negative musculoskeletal ROS (+)   Abdominal   Peds negative pediatric ROS (+)  Hematology negative hematology ROS (+)   Anesthesia Other Findings   Reproductive/Obstetrics negative OB ROS                             Anesthesia Physical  Anesthesia Plan  ASA: II  Anesthesia Plan: Spinal   Post-op Pain Management:  Regional for Post-op pain   Induction:   PONV Risk Score and Plan: 1 and Ondansetron  Airway Management Planned: Simple Face Mask  Additional Equipment:   Intra-op Plan:   Post-operative Plan:   Informed Consent: I have reviewed the patients History and Physical, chart, labs and discussed the procedure including the risks, benefits and alternatives for the proposed anesthesia with the patient or authorized representative who has indicated his/her understanding and acceptance.   Dental advisory given  Plan Discussed with: CRNA and Surgeon  Anesthesia Plan Comments:         Anesthesia Quick Evaluation

## 2018-01-28 NOTE — Interval H&P Note (Signed)
History and Physical Interval Note:  01/28/2018 8:54 AM  Robert Petty  has presented today for surgery, with the diagnosis of RIGHT KNEE OSTEOARTHRITIS  The various methods of treatment have been discussed with the patient and family. After consideration of risks, benefits and other options for treatment, the patient has consented to  Procedure(s): TOTAL KNEE ARTHROPLASTY (Right) as a surgical intervention .  The patient's history has been reviewed, patient examined, no change in status, stable for surgery.  I have reviewed the patient's chart and labs.  Questions were answered to the patient's satisfaction.     Nestor LewandowskyFrank J Nickalos Petersen

## 2018-01-28 NOTE — Discharge Instructions (Signed)

## 2018-01-28 NOTE — Anesthesia Procedure Notes (Signed)
Spinal  Patient location during procedure: OR Staffing Anesthesiologist: Izaiha Lo, MD Performed: anesthesiologist  Preanesthetic Checklist Completed: patient identified, site marked, surgical consent, pre-op evaluation, timeout performed, IV checked, risks and benefits discussed and monitors and equipment checked Spinal Block Patient position: sitting Prep: Betadine Patient monitoring: heart rate, continuous pulse ox and blood pressure Approach: right paramedian Location: L3-4 Injection technique: single-shot Needle Needle type: Spinocan  Needle gauge: 22 G Needle length: 9 cm Additional Notes Expiration date of kit checked and confirmed. Patient tolerated procedure well, without complications.       

## 2018-01-29 ENCOUNTER — Encounter (HOSPITAL_COMMUNITY): Payer: Self-pay | Admitting: Orthopedic Surgery

## 2018-01-29 DIAGNOSIS — M1711 Unilateral primary osteoarthritis, right knee: Secondary | ICD-10-CM | POA: Diagnosis not present

## 2018-01-29 LAB — CBC
HEMATOCRIT: 37.7 % — AB (ref 39.0–52.0)
Hemoglobin: 12 g/dL — ABNORMAL LOW (ref 13.0–17.0)
MCH: 30.5 pg (ref 26.0–34.0)
MCHC: 31.8 g/dL (ref 30.0–36.0)
MCV: 95.7 fL (ref 80.0–100.0)
PLATELETS: 266 10*3/uL (ref 150–400)
RBC: 3.94 MIL/uL — ABNORMAL LOW (ref 4.22–5.81)
RDW: 12.6 % (ref 11.5–15.5)
WBC: 17.1 10*3/uL — ABNORMAL HIGH (ref 4.0–10.5)
nRBC: 0 % (ref 0.0–0.2)

## 2018-01-29 LAB — BASIC METABOLIC PANEL
Anion gap: 8 (ref 5–15)
BUN: 15 mg/dL (ref 8–23)
CALCIUM: 8.8 mg/dL — AB (ref 8.9–10.3)
CO2: 25 mmol/L (ref 22–32)
Chloride: 108 mmol/L (ref 98–111)
Creatinine, Ser: 0.89 mg/dL (ref 0.61–1.24)
GFR calc Af Amer: >60 mL/min (ref >60)
GLUCOSE: 157 mg/dL — AB (ref 70–99)
Potassium: 4.1 mmol/L (ref 3.5–5.1)
Sodium: 141 mmol/L (ref 135–145)

## 2018-01-29 NOTE — Progress Notes (Signed)
PATIENT ID: Robert Petty  MRN: [id number]  DOB/AGE:  1945-05-26 / 72 y.o.  1 Day Post-Op Procedure(s) (LRB): TOTAL KNEE ARTHROPLASTY (Right)    PROGRESS NOTE Subjective: Patient is alert, oriented, no Nausea, no Vomiting, yes passing gas. Taking PO well. Denies SOB, Chest or Calf Pain. Using Incentive Spirometer, PAS in place. Ambulate WBAT 60', Patient reports pain as 1/10 .    Objective: Vital signs in last 24 hours: Vitals:   01/28/18 1906 01/28/18 2236 01/29/18 0234 01/29/18 0547  BP:  (Abnormal) 127/92 (Abnormal) 146/91 (Abnormal) 141/99  Pulse: 98 100 (Abnormal) 106 91  Resp:  16 15 17   Temp:  (Abnormal) 97.5 F (36.4 C) 98.1 F (36.7 C) 98.2 F (36.8 C)  TempSrc:  Oral Oral Oral  SpO2:  95% 97% 99%  Weight:      Height:          Intake/Output from previous day: I/O last 3 completed shifts: In: 4015.3 [P.O.:960; I.V.:3055.3] Out: 3430 [Urine:3330; Blood:100]   Intake/Output this shift: No intake/output data recorded.   LABORATORY DATA: Recent Labs    01/29/18 0413  WBC 17.1*  HGB 12.0*  HCT 37.7*  PLT 266  NA 141  K 4.1  CL 108  CO2 25  BUN 15  CREATININE 0.89  GLUCOSE 157*  CALCIUM 8.8*    Examination: Neurologically intact ABD soft Neurovascular intact Sensation intact distally Intact pulses distally Dorsiflexion/Plantar flexion intact Incision: dressing C/D/I No cellulitis present Compartment soft}  Assessment:   1 Day Post-Op Procedure(s) (LRB): TOTAL KNEE ARTHROPLASTY (Right) ADDITIONAL DIAGNOSIS: Expected Acute Blood Loss Anemia,   Patient's anticipated LOS is less than 2 midnights, meeting these requirements: - Younger than 3665 - Lives within 1 hour of care - Has a competent adult at home to recover with post-op recover - NO history of  - Chronic pain requiring opiods  - Diabetes  - Coronary Artery Disease  - Heart failure  - Heart attack  - Stroke  - DVT/VTE  - Cardiac arrhythmia  - Respiratory Failure/COPD  - Renal  failure  - Anemia  - Advanced Liver disease       Plan: PT/OT WBAT, AROM and PROM  DVT Prophylaxis:  SCDx72hrs, ASA 81 mg BID x 2 weeks DISCHARGE PLAN: Home DISCHARGE NEEDS: HHPT, Walker and 3-in-1 comode seat     Robert Petty 01/29/2018, 7:57 AM Patient ID: Robert Petty, male   DOB: 1945-05-26, 72 y.o.   MRN: [id number]

## 2018-01-29 NOTE — Discharge Summary (Signed)
Patient ID: Robert Petty MRN: [id number] DOB/AGE: [birth year] 72 y.o.  Admit date: 01/28/2018 Discharge date: 01/29/2018  Admission Diagnoses:  Principal Problem:   Osteoarthritis of right knee Active Problems:   Arthritis of right knee   Discharge Diagnoses:  Same  Past Medical History:  Diagnosis Date  . Hyperlipidemia   . Kidney stones     Surgeries: Procedure(s): TOTAL KNEE ARTHROPLASTY on 01/28/2018   Consultants:   Discharged Condition: Improved  Hospital Course: Robert Petty is an 72 y.o. male who was admitted 01/28/2018 for operative treatment ofOsteoarthritis of right knee. Patient has severe unremitting pain that affects sleep, daily activities, and work/hobbies. After pre-op clearance the patient was taken to the operating room on 01/28/2018 and underwent  Procedure(s): TOTAL KNEE ARTHROPLASTY.    Patient was given perioperative antibiotics:  Anti-infectives (From admission, onward)   Start     Dose/Rate Route Frequency Ordered Stop   01/28/18 0715  ceFAZolin (ANCEF) IVPB 2g/100 mL premix     2 g 200 mL/hr over 30 Minutes Intravenous On call to O.R. 01/28/18 0710 01/28/18 1019       Patient was given sequential compression devices, early ambulation, and chemoprophylaxis to prevent DVT.  Patient benefited maximally from hospital stay and there were no complications.    Recent vital signs:  Patient Vitals for the past 24 hrs:  BP Temp Temp src Pulse Resp SpO2  01/29/18 0547 (Abnormal) 141/99 98.2 F (36.8 C) Oral 91 17 99 %  01/29/18 0234 (Abnormal) 146/91 98.1 F (36.7 C) Oral (Abnormal) 106 15 97 %  01/28/18 2236 (Abnormal) 127/92 (Abnormal) 97.5 F (36.4 C) Oral 100 16 95 %  01/28/18 1906 no documentation no documentation no documentation 98 no documentation no documentation  01/28/18 1823 139/82 97.8 F (36.6 C) Oral 85 16 95 %  01/28/18 1528 133/88 98 F (36.7 C) Oral 60 16 100 %  01/28/18 1434 137/76 97.6 F (36.4 C) Oral (Abnormal) 58  16 100 %  01/28/18 1326 (Abnormal) 142/73 98.2 F (36.8 C) Oral (Abnormal) 55 no documentation 99 %  01/28/18 1224 128/84 (Abnormal) 97.5 F (36.4 C) Oral (Abnormal) 54 15 99 %  01/28/18 1215 130/71 98.4 F (36.9 C) no documentation (Abnormal) 57 16 98 %  01/28/18 1200 129/74 no documentation no documentation (Abnormal) 50 15 99 %  01/28/18 1145 121/72 no documentation no documentation (Abnormal) 55 12 100 %  01/28/18 1130 116/69 no documentation no documentation (Abnormal) 55 13 100 %  01/28/18 1120 114/63 97.7 F (36.5 C) no documentation 62 16 97 %  01/28/18 0842 no documentation no documentation no documentation 67 14 98 %  01/28/18 0841 no documentation no documentation no documentation 66 19 99 %  01/28/18 0840 no documentation no documentation no documentation 66 17 99 %  01/28/18 0839 no documentation no documentation no documentation 66 19 96 %  01/28/18 0838 no documentation no documentation no documentation 62 16 95 %  01/28/18 0837 135/78 no documentation no documentation 60 13 97 %  01/28/18 0836 no documentation no documentation no documentation (Abnormal) 56 18 97 %  01/28/18 0835 no documentation no documentation no documentation (Abnormal) 58 11 99 %  01/28/18 0834 no documentation no documentation no documentation (Abnormal) 59 18 100 %  01/28/18 0833 no documentation no documentation no documentation (Abnormal) 55 19 99 %  01/28/18 0832 no documentation no documentation no documentation (Abnormal) 58 12 99 %  01/28/18 0831 no documentation no documentation no documentation 64 16 98 %  Recent laboratory studies:  Recent Labs    01/29/18 0413  WBC 17.1*  HGB 12.0*  HCT 37.7*  PLT 266  NA 141  K 4.1  CL 108  CO2 25  BUN 15  CREATININE 0.89  GLUCOSE 157*  CALCIUM 8.8*     Discharge Medications:   Allergies as of 01/29/2018   No Known Allergies     Medication List    Take these medications   aspirin EC 81 MG tablet Take 1 tablet (81 mg total) by  mouth 2 (two) times daily.   atorvastatin 40 MG tablet Commonly known as:  LIPITOR Take 1 tablet (40 mg total) by mouth daily. What changed:  when to take this   hydroxypropyl methylcellulose / hypromellose 2.5 % ophthalmic solution Commonly known as:  ISOPTO TEARS / GONIOVISC Place 1 drop into both eyes daily as needed for dry eyes.   multivitamin with minerals Tabs tablet Take 1 tablet by mouth daily.   oxyCODONE-acetaminophen 5-325 MG tablet Commonly known as:  PERCOCET/ROXICET Take 1 tablet by mouth every 4 (four) hours as needed for severe pain.   sodium chloride 0.65 % Soln nasal spray Commonly known as:  OCEAN Place 1 spray into both nostrils daily.   tiZANidine 2 MG tablet Commonly known as:  ZANAFLEX Take 1 tablet (2 mg total) by mouth every 6 (six) hours as needed.        Durable Medical Equipment  (From admission, onward)         Start     Ordered   01/28/18 1231  DME Walker rolling  Once    Question:  Patient needs a walker to treat with the following condition  Answer:  Status post right knee replacement   01/28/18 1231   01/28/18 1231  DME 3 n 1  Once     01/28/18 1231           Discharge Care Instructions  (From admission, onward)         Start     Ordered   01/29/18 0000  Change dressing    Comments:  Change dressing if > 40% darainage on window   01/29/18 0800          Diagnostic Studies: No results found.  Disposition: Discharge disposition: 01-Home or Self Care       Discharge Instructions    Call MD / Call 911   Complete by:  As directed    If you experience chest pain or shortness of breath, CALL 911 and be transported to the hospital emergency room.  If you develope a fever above 101 F, pus (white drainage) or increased drainage or redness at the wound, or calf pain, call your surgeon's office.   Change dressing   Complete by:  As directed    Change dressing if > 40% darainage on window   Constipation Prevention    Complete by:  As directed    Drink plenty of fluids.  Prune juice may be helpful.  You may use a stool softener, such as Colace (over the counter) 100 mg twice a day.  Use MiraLax (over the counter) for constipation as needed.   Diet - low sodium heart healthy   Complete by:  As directed    Increase activity slowly as tolerated   Complete by:  As directed       Follow-up Information    Gean Birch[place]owan, Natalia Wittmeyer, MD In 2 weeks.   Specialty:  Orthopedic Surgery Contact information: [address] [place] [address] [phone]            Signed: Nestor Lewandowsky 01/29/2018, 8:00 AM

## 2018-01-29 NOTE — Care Management Note (Signed)
Case Management Note  Patient Details  Name: Robert Petty MRN: [id number] Date of Birth: 08-10-1945  Subjective/Objective:  Discharge planning, spoke with patient at bedside. [facility] and [facility].   Action/Plan: [facility]. Has DME. [phone]                  Expected Discharge Date:  01/29/18               Expected Discharge Plan:  Home w Home Health Services  In-House Referral:  NA  Discharge planning Services  CM Consult  Post Acute Care Choice:  Home Health Choice offered to:  Patient  DME Arranged:  N/A DME Agency:  NA  HH Arranged:  PT HH Agency:  [facility] (formerly [facility])  Status of Service:  Completed, signed off  If discussed at [employer] Meetings, dates discussed:    Additional Comments:  Alexis Goodelleele, Noel Rodier K, RN 01/29/2018, 9:25 AM

## 2018-01-29 NOTE — Progress Notes (Signed)
RN reviewed discharge instructions with patient and family. All questions answered.   Paperwork and prescriptions given.   NT/ nursing student took patient down in wheelchair with all belongings to patient car.

## 2020-06-29 ENCOUNTER — Other Ambulatory Visit: Payer: Self-pay | Admitting: Orthopedic Surgery

## 2020-06-29 DIAGNOSIS — M455 Ankylosing spondylitis of thoracolumbar region: Secondary | ICD-10-CM

## 2020-08-17 ENCOUNTER — Ambulatory Visit: Payer: Medicare Other | Attending: Internal Medicine

## 2020-08-17 ENCOUNTER — Other Ambulatory Visit (HOSPITAL_BASED_OUTPATIENT_CLINIC_OR_DEPARTMENT_OTHER): Payer: Self-pay

## 2020-08-17 ENCOUNTER — Other Ambulatory Visit: Payer: Self-pay

## 2020-08-17 DIAGNOSIS — Z23 Encounter for immunization: Secondary | ICD-10-CM

## 2020-08-17 MED ORDER — PFIZER-BIONT COVID-19 VAC-TRIS 30 MCG/0.3ML IM SUSP
INTRAMUSCULAR | 0 refills | Status: AC
  Filled 2020-08-17: qty 0.3, 1d supply, fill #0

## 2020-08-17 NOTE — Progress Notes (Signed)
   Covid-19 Vaccination Clinic  Name:  Robert Petty    MRN: [id number] DOB: Apr 20, 1945  08/17/2020  Mr. Boateng was observed post Covid-19 immunization for 15 minutes without incident. He was provided with Vaccine Information Sheet and instruction to access the V-Safe system.   Mr. Lovejoy was instructed to call 911 with any severe reactions post vaccine: Difficulty breathing  Swelling of face and throat  A fast heartbeat  A bad rash all over body  Dizziness and weakness   Immunizations Administered     Name Date Dose VIS Date Route   PFIZER Comrnaty(Gray TOP) Covid-19 Vaccine 08/17/2020 12:38 PM 0.3 mL 02/12/2020 Intramuscular   Manufacturer: [employer], [employer]   Lot: [id number]   NDC: [phone]

## 2020-11-24 ENCOUNTER — Ambulatory Visit: Payer: Medicare Other | Attending: Internal Medicine

## 2020-11-24 ENCOUNTER — Other Ambulatory Visit (HOSPITAL_BASED_OUTPATIENT_CLINIC_OR_DEPARTMENT_OTHER): Payer: Self-pay

## 2020-11-24 DIAGNOSIS — Z23 Encounter for immunization: Secondary | ICD-10-CM

## 2020-11-24 MED ORDER — INFLUENZA VAC A&B SA ADJ QUAD 0.5 ML IM PRSY
PREFILLED_SYRINGE | INTRAMUSCULAR | 0 refills | Status: DC
  Filled 2020-11-24: qty 0.5, 1d supply, fill #0

## 2020-11-24 MED ORDER — PFIZER COVID-19 VAC BIVALENT 30 MCG/0.3ML IM SUSP
INTRAMUSCULAR | 0 refills | Status: AC
  Filled 2020-11-24: qty 0.3, 1d supply, fill #0

## 2020-11-24 NOTE — Progress Notes (Signed)
   Covid-19 Vaccination Clinic  Name:  Robert Petty    MRN: [id number] DOB: Oct 05, 1945  11/24/2020  Mr. Robert Petty was observed post Covid-19 immunization for 15 minutes without incident. He was provided with Vaccine Information Sheet and instruction to access the V-Safe system.   Mr. Robert Petty was instructed to call 911 with any severe reactions post vaccine: Difficulty breathing  Swelling of face and throat  A fast heartbeat  A bad rash all over body  Dizziness and weakness

## 2021-08-31 ENCOUNTER — Other Ambulatory Visit: Payer: Self-pay | Admitting: Orthopedic Surgery

## 2021-08-31 DIAGNOSIS — M25511 Pain in right shoulder: Secondary | ICD-10-CM

## 2021-09-05 ENCOUNTER — Other Ambulatory Visit: Payer: Self-pay | Admitting: Orthopedic Surgery

## 2021-09-05 DIAGNOSIS — M25511 Pain in right shoulder: Secondary | ICD-10-CM

## 2021-09-27 ENCOUNTER — Other Ambulatory Visit: Payer: Medicare Other

## 2021-10-21 NOTE — Patient Instructions (Signed)
DUE TO SPACE LIMITATIONS, ONLY TWO VISITORS  (aged 77 and older) ARE ALLOWED TO COME WITH YOU AND STAY IN THE WAITING ROOM DURING YOUR PRE OP AND PROCEDURE.   **NO VISITORS ARE ALLOWED IN THE SHORT STAY AREA OR RECOVERY ROOM!!**  IF YOU WILL BE ADMITTED INTO THE HOSPITAL YOU ARE ALLOWED ONLY FOUR SUPPORT PEOPLE DURING VISITATION HOURS (7 AM -8PM)   The support person(s) must pass our screening, and use Hand sanitizing gel. Visitors GUEST BADGE MUST BE WORN VISIBLY  One adult visitor may remain with you overnight and MUST be in the room by 8 P.M.   You are not required to quarantine at this time prior to your surgery. However, you must do this: Hand Hygiene often Do NOT share personal items Notify your provider if you are in close contact with someone who has COVID or you develop fever 100.4 or greater, new onset of sneezing, cough, sore throat, shortness of breath or body aches.       Your procedure is scheduled on:  Friday  October 28, 2021  Report to [facility] Main Entrance.  Report to admitting at: 05:15    AM  +++++Call this number if you have any questions or problems the morning of surgery [phone]  Do not eat food :After Midnight the night prior to your surgery/procedure.  After Midnight you may have the following liquids until  04:30  AM DAY OF SURGERY  Clear Liquid Diet Water Black Coffee (sugar ok, NO MILK/CREAM OR CREAMERS)  Tea (sugar ok, NO MILK/CREAM OR CREAMERS) regular and decaf                             Plain Jell-O (NO RED)                                           Fruit ices (not with fruit pulp, NO RED)                                     Popsicles (NO RED)                                                                  Juice: apple, WHITE grape, WHITE cranberry Sports drinks like Gatorade (NO RED)                 FOLLOW ANY ADDITIONAL PRE OP INSTRUCTIONS YOU RECEIVED FROM YOUR SURGEON'S OFFICE!!!   Oral Hygiene is also important to reduce  your risk of infection.        Remember - BRUSH YOUR TEETH THE MORNING OF SURGERY WITH YOUR REGULAR TOOTHPASTE  Do NOT smoke after Midnight the night before surgery.  Take ONLY these medicines the morning of surgery with A SIP OF WATER: Tamsulosin and if needed you may take oxycodone  Bring CPAP mask and tubing day of surgery.                   You may not have any metal on your body  including jewelry, and body piercing  Do not wear lotions, powders, cologne, or deodorant  Men may shave face and neck.  Contacts, Hearing Aids, dentures or bridgework may not be worn into surgery.   You may bring a small overnight bag with you on the day of surgery, only pack items that are not valuable .[facility] IS NOT RESPONSIBLE   FOR VALUABLES THAT ARE LOST OR STOLEN.   DO NOT BRING YOUR HOME MEDICATIONS TO THE HOSPITAL. PHARMACY WILL DISPENSE MEDICATIONS LISTED ON YOUR MEDICATION LIST TO YOU DURING YOUR ADMISSION IN THE HOSPITAL!   Special Instructions: Bring a copy of your healthcare power of attorney and living will documents the day of surgery, if you wish to have them scanned into your [facility] Records- EPIC  Please read over the following fact sheets you were given: IF YOU HAVE QUESTIONS ABOUT YOUR PRE-OP INSTRUCTIONS, PLEASE CALL [phone]  (KAY)   [facility] - Preparing for Surgery Before surgery, you can play an important role.  Because skin is not sterile, your skin needs to be as free of germs as possible.  You can reduce the number of germs on your skin by washing with CHG (chlorahexidine gluconate) soap before surgery.  CHG is an antiseptic cleaner which kills germs and bonds with the skin to continue killing germs even after washing. Please DO NOT use if you have an allergy to CHG or antibacterial soaps.  If your skin becomes reddened/irritated stop using the CHG and inform your nurse when you arrive at Short Stay. Do not shave (including legs and underarms) for at least  48 hours prior to the first CHG shower.  You may shave your face/neck.  Please follow these instructions carefully:  1.  Shower with CHG Soap the night before surgery and the  morning of surgery.  2.  If you choose to wash your hair, wash your hair first as usual with your normal  shampoo.  3.  After you shampoo, rinse your hair and body thoroughly to remove the shampoo.                             4.  Use CHG as you would any other liquid soap.  You can apply chg directly to the skin and wash.  Gently with a scrungie or clean washcloth.  5.  Apply the CHG Soap to your body ONLY FROM THE NECK DOWN.   Do not use on face/ open                           Wound or open sores. Avoid contact with eyes, ears mouth and genitals (private parts).                       Wash face,  Genitals (private parts) with your normal soap.             6.  Wash thoroughly, paying special attention to the area where your  surgery  will be performed.  7.  Thoroughly rinse your body with warm water from the neck down.  8.  DO NOT shower/wash with your normal soap after using and rinsing off the CHG Soap.            9.  Pat yourself dry with a clean towel.            10.  Wear clean  pajamas.            11.  Place clean sheets on your bed the night of your first shower and do not  sleep with pets.  ON THE DAY OF SURGERY : Do not apply any lotions/deodorants the morning of surgery.  Please wear clean clothes to the hospital/surgery center.    FAILURE TO FOLLOW THESE INSTRUCTIONS MAY RESULT IN THE CANCELLATION OF YOUR SURGERY  PATIENT SIGNATURE_________________________________  NURSE SIGNATURE__________________________________  ________________________________________________________________________

## 2021-10-21 NOTE — Progress Notes (Signed)
COVID Vaccine received:  []  No [x]  Yes Date of any COVID positive Test in last 90 days:  PCP - , MD Cardiologist - , MD  Chest x-ray [phone]  Epic EKG -  11-14-2017  Epic Stress Test - 2017  Epic ECHO - none Cardiac Cath - 09-22-15  LHC by Dr. 2018  Pacemaker/ICD device     []  N/A Spinal Cord Stimulator:[]  No []  Yes   Other Implants:   History of Sleep Apnea? []  No []  Yes   Sleep Study Date:   CPAP used?- []  No []  Yes  (Instruct to bring their mask & Tubing)  Does the patient monitor blood sugar? []  No []  Yes  []  N/A Does patient have a 09-24-15 or Dexacom? []  No []  Yes   Fasting Blood Sugar Ranges-  Checks Blood Sugar _____ times a day  Blood Thinner Instructions: Aspirin Instructions: Last Dose:  ERAS Protocol Ordered: []  No  []  Yes PRE-SURGERY []  ENSURE  []  G2   Comments:   Activity level: Patient can / can not climb a flight of stairs without difficulty;  []  No CP  []  No SOB,  but would have ______   Anesthesia review:   Patient denies shortness of breath, fever, cough and chest pain at PAT appointment.  Patient verbalized understanding and agreement to the Pre-Surgical Instructions that were given to them at this PAT appointment. Patient was also educated of the need to review these PAT instructions again prior to his/her surgery.I reviewed the appropriate phone numbers to call if they have any and questions or concerns.

## 2021-10-24 ENCOUNTER — Encounter (HOSPITAL_COMMUNITY)
Admission: RE | Admit: 2021-10-24 | Discharge: 2021-10-24 | Disposition: A | Discharge status: Home / Self Care | Payer: Medicare Other | Source: Ambulatory Visit | Attending: Orthopedic Surgery | Admitting: Orthopedic Surgery

## 2021-10-24 ENCOUNTER — Encounter (HOSPITAL_COMMUNITY): Payer: Self-pay

## 2021-10-24 ENCOUNTER — Other Ambulatory Visit: Payer: Self-pay

## 2021-10-24 VITALS — BP 108/75 | HR 72 | Temp 98.1°F | Resp 16 | Ht 70.5 in | Wt 203.0 lb

## 2021-10-24 DIAGNOSIS — I2510 Atherosclerotic heart disease of native coronary artery without angina pectoris: Secondary | ICD-10-CM | POA: Insufficient documentation

## 2021-10-24 DIAGNOSIS — Z01818 Encounter for other preprocedural examination: Secondary | ICD-10-CM | POA: Insufficient documentation

## 2021-10-24 HISTORY — DX: Atherosclerotic heart disease of native coronary artery without angina pectoris: I25.10

## 2021-10-24 HISTORY — DX: Unspecified osteoarthritis, unspecified site: M19.90

## 2021-10-24 LAB — BASIC METABOLIC PANEL
Anion gap: 5 (ref 5–15)
BUN: 19 mg/dL (ref 8–23)
CO2: 26 mmol/L (ref 22–32)
Calcium: 9.1 mg/dL (ref 8.9–10.3)
Chloride: 108 mmol/L (ref 98–111)
Creatinine, Ser: 0.84 mg/dL (ref 0.61–1.24)
GFR, Estimated: >60 mL/min (ref >60)
Glucose, Bld: 98 mg/dL (ref 70–99)
Potassium: 4 mmol/L (ref 3.5–5.1)
Sodium: 139 mmol/L (ref 135–145)

## 2021-10-24 LAB — SURGICAL PCR SCREEN
MRSA, PCR: NEGATIVE
Staphylococcus aureus: NEGATIVE

## 2021-10-24 LAB — CBC
HCT: 43.1 % (ref 39.0–52.0)
Hemoglobin: 14.4 g/dL (ref 13.0–17.0)
MCH: 31.6 pg (ref 26.0–34.0)
MCHC: 33.4 g/dL (ref 30.0–36.0)
MCV: 94.7 fL (ref 80.0–100.0)
Platelets: 228 10*3/uL (ref 150–400)
RBC: 4.55 MIL/uL (ref 4.22–5.81)
RDW: 13.4 % (ref 11.5–15.5)
WBC: 5.3 10*3/uL (ref 4.0–10.5)
nRBC: 0 % (ref 0.0–0.2)

## 2021-10-25 NOTE — H&P (Signed)
Patient's anticipated LOS is less than 2 midnights, meeting these requirements: - Younger than 62 - Lives within 1 hour of care - Has a competent adult at home to recover with post-op recover - NO history of  - Chronic pain requiring opiods  - Diabetes  - Coronary Artery Disease  - Heart failure  - Heart attack  - Stroke  - DVT/VTE  - Cardiac arrhythmia  - Respiratory Failure/COPD  - Renal failure  - Anemia  - Advanced Liver disease     Robert Petty is an 76 y.o. male.    Chief Complaint: right shoulder pain  HPI: Pt is a 76 y.o. male complaining of right shoulder pain for multiple years. Pain had continually increased since the beginning. X-rays in the clinic show end-stage arthritic changes of the right shoulder. Pt has tried various conservative treatments which have failed to alleviate their symptoms, including injections and therapy. Various options are discussed with the patient. Risks, benefits and expectations were discussed with the patient. Patient understand the risks, benefits and expectations and wishes to proceed with surgery.   PCP:  Haywood Pao, MD  D/C Plans: Home  PMH: Past Medical History:  Diagnosis Date   Arthritis    Coronary artery disease    Hyperlipidemia    Kidney stones     PSH: Past Surgical History:  Procedure Laterality Date   BACK SURGERY     CARDIAC CATHETERIZATION N/A 09/22/2015   Procedure: Left Heart Cath and Coronary Angiography;  Surgeon: Sherren Mocha, MD;  Location: [facility];  Service: Cardiovascular;  Laterality: N/A;   EYE SURGERY Bilateral    cataract   FOOT SURGERY Left    HERNIATED DISC REPAIR     SHOULDER SURGERY     TOTAL KNEE ARTHROPLASTY Left 12/03/2017   Procedure: LEFT TOTAL KNEE ARTHROPLASTY;  Surgeon: Frederik Pear, MD;  Location: [facility];  Service: Orthopedics;  Laterality: Left;   TOTAL KNEE ARTHROPLASTY Right 01/28/2018   Procedure: TOTAL KNEE ARTHROPLASTY;  Surgeon: Frederik Pear, MD;   Location: [facility];  Service: Orthopedics;  Laterality: Right;  with block    Social History:  reports that he quit smoking about 44 years ago. His smoking use included cigarettes. He has never used smokeless tobacco. He reports current alcohol use. He reports that he does not use drugs. BMI: Estimated body mass index is 28.72 kg/m as calculated from the following:   Height as of 10/24/21: 5' 10.5" (1.791 m).   Weight as of 10/24/21: 92.1 kg.  No results found for: "ALBUMIN" Diabetes: Patient does not have a diagnosis of diabetes.     Smoking Status: Social History   Tobacco Use  Smoking Status Former   Types: Cigarettes   Quit date: 03/01/1977   Years since quitting: 44.6  Smokeless Tobacco Never   The patient is not currently a tobacco user. Counseling given: Not Answered     Allergies:  No Known Allergies  Medications: No current facility-administered medications for this encounter.   Current Outpatient Medications  Medication Sig Dispense Refill   atorvastatin (LIPITOR) 40 MG tablet Take 1 tablet (40 mg total) by mouth daily. (Patient taking differently: Take 40 mg by mouth every evening.) 90 tablet 3   Multiple Vitamin (MULTIVITAMIN WITH MINERALS) TABS tablet Take 1 tablet by mouth daily.     tamsulosin (FLOMAX) 0.4 MG CAPS capsule Take 0.4 mg by mouth daily.     aspirin EC 81 MG tablet Take 1 tablet (81 mg  total) by mouth 2 (two) times daily. (Patient not taking: Reported on 10/20/2021) 60 tablet 0   COVID-19 mRNA bivalent vaccine, Pfizer, (PFIZER COVID-19 VAC BIVALENT) injection Inject into the muscle. (Patient not taking: Reported on 10/20/2021) 0.3 mL 0   COVID-19 mRNA Vac-TriS, Pfizer, (PFIZER-BIONT COVID-19 VAC-TRIS) SUSP injection Inject into the muscle. (Patient not taking: Reported on 10/20/2021) 0.3 mL 0   influenza vaccine adjuvanted (FLUAD) 0.5 ML injection Inject into the muscle. (Patient not taking: Reported on 10/20/2021) 0.5 mL 0   oxyCODONE-acetaminophen  (PERCOCET/ROXICET) 5-325 MG tablet Take 1 tablet by mouth every 4 (four) hours as needed for severe pain. 30 tablet 0   tiZANidine (ZANAFLEX) 2 MG tablet Take 1 tablet (2 mg total) by mouth every 6 (six) hours as needed. (Patient not taking: Reported on 10/20/2021) 60 tablet 0    Results for orders placed or performed during the hospital encounter of 10/24/21 (from the past 48 hour(s))  Surgical pcr screen     Status: None   Collection Time: 10/24/21  8:31 AM   Specimen: Nasal Mucosa; Nasal Swab  Result Value Ref Range   MRSA, PCR NEGATIVE NEGATIVE   Staphylococcus aureus NEGATIVE NEGATIVE    Comment: (NOTE) The Xpert SA Assay (FDA approved for NASAL specimens in patients 55 years of age and older), is one component of a comprehensive surveillance program. It is not intended to diagnose infection nor to guide or monitor treatment. Performed at [facility], [address], [place], [place] [address]   Basic metabolic panel per protocol     Status: None   Collection Time: 10/24/21  8:31 AM  Result Value Ref Range   Sodium 139 135 - 145 mmol/L   Potassium 4.0 3.5 - 5.1 mmol/L   Chloride 108 98 - 111 mmol/L   CO2 26 22 - 32 mmol/L   Glucose, Bld 98 70 - 99 mg/dL    Comment: Glucose reference range applies only to samples taken after fasting for at least 8 hours.   BUN 19 8 - 23 mg/dL   Creatinine, Ser 4.25 0.61 - 1.24 mg/dL   Calcium 9.1 8.9 - 95.6 mg/dL   GFR, Estimated >38 >75 mL/min    Comment: (NOTE) Calculated using the CKD-EPI Creatinine Equation (2021)    Anion gap 5 5 - 15    Comment: Performed at [facility], [address], [place], [place] [address]  CBC per protocol     Status: None   Collection Time: 10/24/21  8:31 AM  Result Value Ref Range   WBC 5.3 4.0 - 10.5 K/uL   RBC 4.55 4.22 - 5.81 MIL/uL   Hemoglobin 14.4 13.0 - 17.0 g/dL   HCT 95.1 88.4 - 16.6 %   MCV 94.7 80.0 - 100.0 fL   MCH 31.6 26.0 - 34.0 pg   MCHC 33.4 30.0  - 36.0 g/dL   RDW 06.3 01.6 - 01.0 %   Platelets 228 150 - 400 K/uL   nRBC 0.0 0.0 - 0.2 %    Comment: Performed at [facility], [address] [place], [place] [address]   No results found.  ROS: Pain with rom of the right upper extremity  Physical Exam: Alert and oriented 76 y.o. male in no acute distress Cranial nerves 2-12 intact Cervical spine: full rom with no tenderness, nv intact distally Chest: active breath sounds bilaterally, no wheeze rhonchi or rales Heart: regular rate and rhythm, no murmur Abd: non tender non  distended with active bowel sounds Hip is stable with rom  Right shoulder painful and weak rom Nv intact distally Crepitus with rom  Assessment/Plan Assessment: right shoulder cuff arthropathy  Plan:  Patient will undergo a right reverse total shoulder by Dr. Ranell Patrick at [facility] Risks benefits and expectations were discussed with the patient. Patient understand risks, benefits and expectations and wishes to proceed. Preoperative templating of the joint replacement has been completed, documented, and submitted to the Operating Room personnel in order to optimize intra-operative equipment management.   Alphonsa Overall PA-C, MPAS [facility] is now [employer] [address], [place], [place] [address] Phone: [phone] [email or web address] Facebook  [employer]

## 2021-10-27 NOTE — Anesthesia Preprocedure Evaluation (Addendum)
Anesthesia Evaluation  Patient identified by MRN, date of birth, ID band Patient awake    Reviewed: Allergy & Precautions, NPO status , Patient's Chart, lab work & pertinent test results  Airway Mallampati: I  TM Distance: >3 FB Neck ROM: Full    Dental no notable dental hx.    Pulmonary former smoker,    Pulmonary exam normal        Cardiovascular + CAD  Normal cardiovascular exam     Neuro/Psych negative neurological ROS  negative psych ROS   GI/Hepatic negative GI ROS, Neg liver ROS,   Endo/Other  negative endocrine ROS  Renal/GU negative Renal ROS     Musculoskeletal  (+) Arthritis ,   Abdominal   Peds  Hematology negative hematology ROS (+)   Anesthesia Other Findings right shoulder rotator cuff tear arthropathy  Reproductive/Obstetrics                            Anesthesia Physical Anesthesia Plan  ASA: 2  Anesthesia Plan: General and Regional   Post-op Pain Management: Regional block*   Induction: Intravenous  PONV Risk Score and Plan: 2 and Ondansetron, Dexamethasone, Midazolam and Treatment may vary due to age or medical condition  Airway Management Planned: Oral ETT  Additional Equipment:   Intra-op Plan:   Post-operative Plan: Extubation in OR  Informed Consent: I have reviewed the patients History and Physical, chart, labs and discussed the procedure including the risks, benefits and alternatives for the proposed anesthesia with the patient or authorized representative who has indicated his/her understanding and acceptance.     Dental advisory given  Plan Discussed with: CRNA  Anesthesia Plan Comments:        Anesthesia Quick Evaluation

## 2021-10-28 ENCOUNTER — Other Ambulatory Visit: Payer: Self-pay

## 2021-10-28 ENCOUNTER — Observation Stay (HOSPITAL_COMMUNITY): Payer: Medicare Other

## 2021-10-28 ENCOUNTER — Encounter (HOSPITAL_COMMUNITY)
Admission: RE | Disposition: A | Discharge status: Home / Self Care | Payer: Self-pay | Source: Home / Self Care | Attending: Orthopedic Surgery

## 2021-10-28 ENCOUNTER — Observation Stay (HOSPITAL_COMMUNITY)
Admission: RE | Admit: 2021-10-28 | Discharge: 2021-10-29 | Disposition: A | Discharge status: Home / Self Care | Payer: Medicare Other | Attending: Orthopedic Surgery | Admitting: Orthopedic Surgery

## 2021-10-28 ENCOUNTER — Ambulatory Visit (HOSPITAL_COMMUNITY): Payer: Medicare Other | Admitting: Physician Assistant

## 2021-10-28 ENCOUNTER — Encounter (HOSPITAL_COMMUNITY): Payer: Self-pay | Admitting: Orthopedic Surgery

## 2021-10-28 ENCOUNTER — Ambulatory Visit (HOSPITAL_BASED_OUTPATIENT_CLINIC_OR_DEPARTMENT_OTHER): Payer: Medicare Other | Admitting: Anesthesiology

## 2021-10-28 DIAGNOSIS — M12811 Other specific arthropathies, not elsewhere classified, right shoulder: Secondary | ICD-10-CM

## 2021-10-28 DIAGNOSIS — Z87891 Personal history of nicotine dependence: Secondary | ICD-10-CM | POA: Insufficient documentation

## 2021-10-28 DIAGNOSIS — Z79899 Other long term (current) drug therapy: Secondary | ICD-10-CM | POA: Diagnosis not present

## 2021-10-28 DIAGNOSIS — Z96653 Presence of artificial knee joint, bilateral: Secondary | ICD-10-CM | POA: Diagnosis not present

## 2021-10-28 DIAGNOSIS — I2510 Atherosclerotic heart disease of native coronary artery without angina pectoris: Secondary | ICD-10-CM

## 2021-10-28 DIAGNOSIS — Z7982 Long term (current) use of aspirin: Secondary | ICD-10-CM | POA: Diagnosis not present

## 2021-10-28 DIAGNOSIS — M75121 Complete rotator cuff tear or rupture of right shoulder, not specified as traumatic: Principal | ICD-10-CM | POA: Insufficient documentation

## 2021-10-28 DIAGNOSIS — M75101 Unspecified rotator cuff tear or rupture of right shoulder, not specified as traumatic: Secondary | ICD-10-CM | POA: Diagnosis not present

## 2021-10-28 DIAGNOSIS — Z96611 Presence of right artificial shoulder joint: Secondary | ICD-10-CM

## 2021-10-28 HISTORY — PX: REVERSE SHOULDER ARTHROPLASTY: SHX5054

## 2021-10-28 SURGERY — ARTHROPLASTY, SHOULDER, TOTAL, REVERSE
Anesthesia: Regional | Site: Shoulder | Laterality: Right

## 2021-10-28 MED ORDER — BUPIVACAINE-EPINEPHRINE (PF) 0.5% -1:200000 IJ SOLN
INTRAMUSCULAR | Status: DC | PRN
  Administered 2021-10-28: 19 mL via PERINEURAL

## 2021-10-28 MED ORDER — DOCUSATE SODIUM 100 MG PO CAPS
100.0000 mg | ORAL_CAPSULE | Freq: Two times a day (BID) | ORAL | Status: DC
  Administered 2021-10-28 – 2021-10-29 (×2): 100 mg via ORAL
  Filled 2021-10-28 (×2): qty 1

## 2021-10-28 MED ORDER — DEXAMETHASONE SODIUM PHOSPHATE 10 MG/ML IJ SOLN
INTRAMUSCULAR | Status: AC
  Filled 2021-10-28: qty 1

## 2021-10-28 MED ORDER — ONDANSETRON HCL 4 MG/2ML IJ SOLN
INTRAMUSCULAR | Status: DC | PRN
  Administered 2021-10-28: 4 mg via INTRAVENOUS

## 2021-10-28 MED ORDER — METOCLOPRAMIDE HCL 5 MG/ML IJ SOLN: 5.0000 mg | Freq: Three times a day (TID) | INTRAMUSCULAR | Status: DC | PRN

## 2021-10-28 MED ORDER — MIDAZOLAM HCL 2 MG/2ML IJ SOLN
INTRAMUSCULAR | Status: AC
  Filled 2021-10-28: qty 2

## 2021-10-28 MED ORDER — FENTANYL CITRATE (PF) 100 MCG/2ML IJ SOLN
INTRAMUSCULAR | Status: DC | PRN
Start: 2021-10-28 — End: 2021-10-28
  Administered 2021-10-28 (×2): 50 ug via INTRAVENOUS

## 2021-10-28 MED ORDER — FENTANYL CITRATE (PF) 100 MCG/2ML IJ SOLN
INTRAMUSCULAR | Status: AC
  Filled 2021-10-28: qty 2

## 2021-10-28 MED ORDER — LACTATED RINGERS IV SOLN: INTRAVENOUS | Status: DC

## 2021-10-28 MED ORDER — LIDOCAINE HCL (PF) 2 % IJ SOLN
INTRAMUSCULAR | Status: AC
  Filled 2021-10-28: qty 5

## 2021-10-28 MED ORDER — ACETAMINOPHEN 500 MG PO TABS
1000.0000 mg | ORAL_TABLET | Freq: Once | ORAL | Status: AC
  Administered 2021-10-28: 1000 mg via ORAL
  Filled 2021-10-28: qty 2

## 2021-10-28 MED ORDER — EPHEDRINE SULFATE-NACL 50-0.9 MG/10ML-% IV SOSY
PREFILLED_SYRINGE | INTRAVENOUS | Status: DC | PRN
  Administered 2021-10-28: 10 mg via INTRAVENOUS
  Administered 2021-10-28: 5 mg via INTRAVENOUS

## 2021-10-28 MED ORDER — BUPIVACAINE-EPINEPHRINE (PF) 0.5% -1:200000 IJ SOLN
INTRAMUSCULAR | Status: AC
  Filled 2021-10-28: qty 30

## 2021-10-28 MED ORDER — FENTANYL CITRATE PF 50 MCG/ML IJ SOSY: 25.0000 ug | PREFILLED_SYRINGE | INTRAMUSCULAR | Status: DC | PRN

## 2021-10-28 MED ORDER — PHENYLEPHRINE HCL (PRESSORS) 10 MG/ML IV SOLN
INTRAVENOUS | Status: DC | PRN
  Administered 2021-10-28 (×2): 80 ug via INTRAVENOUS

## 2021-10-28 MED ORDER — PHENYLEPHRINE HCL-NACL 20-0.9 MG/250ML-% IV SOLN
INTRAVENOUS | Status: DC | PRN
  Administered 2021-10-28: 50 ug/min via INTRAVENOUS

## 2021-10-28 MED ORDER — METHOCARBAMOL 500 MG IVPB - SIMPLE MED: 500.0000 mg | Freq: Four times a day (QID) | INTRAVENOUS | Status: DC | PRN

## 2021-10-28 MED ORDER — ONDANSETRON HCL 4 MG PO TABS: 4.0000 mg | ORAL_TABLET | Freq: Four times a day (QID) | ORAL | Status: DC | PRN

## 2021-10-28 MED ORDER — ATORVASTATIN CALCIUM 40 MG PO TABS
40.0000 mg | ORAL_TABLET | Freq: Every day | ORAL | Status: DC
  Administered 2021-10-29: 40 mg via ORAL
  Filled 2021-10-28: qty 1

## 2021-10-28 MED ORDER — MENTHOL 3 MG MT LOZG: 1.0000 | LOZENGE | OROMUCOSAL | Status: DC | PRN

## 2021-10-28 MED ORDER — ORAL CARE MOUTH RINSE: 15.0000 mL | Freq: Once | OROMUCOSAL | Status: AC

## 2021-10-28 MED ORDER — PHENYLEPHRINE 80 MCG/ML (10ML) SYRINGE FOR IV PUSH (FOR BLOOD PRESSURE SUPPORT)
PREFILLED_SYRINGE | INTRAVENOUS | Status: AC
  Filled 2021-10-28: qty 10

## 2021-10-28 MED ORDER — SODIUM CHLORIDE 0.9 % IV SOLN: INTRAVENOUS | Status: DC

## 2021-10-28 MED ORDER — PHENOL 1.4 % MT LIQD: 1.0000 | OROMUCOSAL | Status: DC | PRN

## 2021-10-28 MED ORDER — ADULT MULTIVITAMIN W/MINERALS CH
1.0000 | ORAL_TABLET | Freq: Every day | ORAL | Status: DC
  Administered 2021-10-28 – 2021-10-29 (×2): 1 via ORAL
  Filled 2021-10-28 (×2): qty 1

## 2021-10-28 MED ORDER — ONDANSETRON HCL 4 MG/2ML IJ SOLN
INTRAMUSCULAR | Status: AC
  Filled 2021-10-28: qty 2

## 2021-10-28 MED ORDER — MIDAZOLAM HCL 5 MG/5ML IJ SOLN
INTRAMUSCULAR | Status: DC | PRN
  Administered 2021-10-28: 2 mg via INTRAVENOUS

## 2021-10-28 MED ORDER — METOCLOPRAMIDE HCL 5 MG PO TABS: 5.0000 mg | ORAL_TABLET | Freq: Three times a day (TID) | ORAL | Status: DC | PRN

## 2021-10-28 MED ORDER — CEFAZOLIN SODIUM-DEXTROSE 2-4 GM/100ML-% IV SOLN
2.0000 g | INTRAVENOUS | Status: AC
  Administered 2021-10-28: 2 g via INTRAVENOUS
  Filled 2021-10-28: qty 100

## 2021-10-28 MED ORDER — POLYETHYLENE GLYCOL 3350 17 G PO PACK: 17.0000 g | PACK | Freq: Every day | ORAL | Status: DC | PRN

## 2021-10-28 MED ORDER — CEFAZOLIN SODIUM-DEXTROSE 2-4 GM/100ML-% IV SOLN
2.0000 g | Freq: Four times a day (QID) | INTRAVENOUS | Status: AC
  Administered 2021-10-28 – 2021-10-29 (×3): 2 g via INTRAVENOUS
  Filled 2021-10-28 (×3): qty 100

## 2021-10-28 MED ORDER — MORPHINE SULFATE (PF) 2 MG/ML IV SOLN: 0.5000 mg | INTRAVENOUS | Status: DC | PRN

## 2021-10-28 MED ORDER — 0.9 % SODIUM CHLORIDE (POUR BTL) OPTIME
TOPICAL | Status: DC | PRN
  Administered 2021-10-28: 1000 mL

## 2021-10-28 MED ORDER — METHOCARBAMOL 500 MG PO TABS: 500.0000 mg | ORAL_TABLET | Freq: Four times a day (QID) | ORAL | Status: DC | PRN

## 2021-10-28 MED ORDER — PROPOFOL 10 MG/ML IV BOLUS
INTRAVENOUS | Status: AC
  Filled 2021-10-28: qty 20

## 2021-10-28 MED ORDER — DEXAMETHASONE SODIUM PHOSPHATE 10 MG/ML IJ SOLN
INTRAMUSCULAR | Status: DC | PRN
  Administered 2021-10-28: 8 mg via INTRAVENOUS

## 2021-10-28 MED ORDER — TAMSULOSIN HCL 0.4 MG PO CAPS
0.4000 mg | ORAL_CAPSULE | Freq: Every day | ORAL | Status: DC
  Administered 2021-10-29: 0.4 mg via ORAL
  Filled 2021-10-28: qty 1

## 2021-10-28 MED ORDER — SUGAMMADEX SODIUM 200 MG/2ML IV SOLN
INTRAVENOUS | Status: DC | PRN
  Administered 2021-10-28: 200 mg via INTRAVENOUS

## 2021-10-28 MED ORDER — ACETAMINOPHEN 325 MG PO TABS: 325.0000 mg | ORAL_TABLET | Freq: Four times a day (QID) | ORAL | Status: DC | PRN

## 2021-10-28 MED ORDER — PHENYLEPHRINE HCL-NACL 20-0.9 MG/250ML-% IV SOLN
INTRAVENOUS | Status: AC
  Filled 2021-10-28: qty 250

## 2021-10-28 MED ORDER — BUPIVACAINE HCL (PF) 0.5 % IJ SOLN
INTRAMUSCULAR | Status: DC | PRN
  Administered 2021-10-28: 15 mL via PERINEURAL

## 2021-10-28 MED ORDER — ONDANSETRON HCL 4 MG/2ML IJ SOLN: 4.0000 mg | Freq: Once | INTRAMUSCULAR | Status: DC | PRN

## 2021-10-28 MED ORDER — EPHEDRINE 5 MG/ML INJ
INTRAVENOUS | Status: AC
  Filled 2021-10-28: qty 5

## 2021-10-28 MED ORDER — PROPOFOL 10 MG/ML IV BOLUS
INTRAVENOUS | Status: DC | PRN
  Administered 2021-10-28: 150 mg via INTRAVENOUS

## 2021-10-28 MED ORDER — CHLORHEXIDINE GLUCONATE 0.12 % MT SOLN
15.0000 mL | Freq: Once | OROMUCOSAL | Status: AC
  Administered 2021-10-28: 15 mL via OROMUCOSAL

## 2021-10-28 MED ORDER — TRAMADOL HCL 50 MG PO TABS: 50.0000 mg | ORAL_TABLET | Freq: Four times a day (QID) | ORAL | Status: DC | PRN

## 2021-10-28 MED ORDER — BUPIVACAINE LIPOSOME 1.3 % IJ SUSP
INTRAMUSCULAR | Status: DC | PRN
  Administered 2021-10-28: 10 mL via PERINEURAL

## 2021-10-28 MED ORDER — AMISULPRIDE (ANTIEMETIC) 5 MG/2ML IV SOLN
10.0000 mg | Freq: Once | INTRAVENOUS | Status: DC | PRN
Start: 2021-10-28 — End: 2021-10-28

## 2021-10-28 MED ORDER — ROCURONIUM BROMIDE 100 MG/10ML IV SOLN
INTRAVENOUS | Status: DC | PRN
  Administered 2021-10-28: 50 mg via INTRAVENOUS

## 2021-10-28 MED ORDER — TRAMADOL HCL 50 MG PO TABS: 50.0000 mg | ORAL_TABLET | Freq: Four times a day (QID) | ORAL | 0 refills | Status: AC | PRN

## 2021-10-28 MED ORDER — ONDANSETRON HCL 4 MG/2ML IJ SOLN: 4.0000 mg | Freq: Four times a day (QID) | INTRAMUSCULAR | Status: DC | PRN

## 2021-10-28 SURGICAL SUPPLY — 76 items
BAG COUNTER SPONGE SURGICOUNT (BAG) IMPLANT
BAG ZIPLOCK 12X15 (MISCELLANEOUS) IMPLANT
BIT DRILL 1.6MX128 (BIT) IMPLANT
BIT DRILL 170X2.5X (BIT) IMPLANT
BIT DRL 170X2.5X (BIT) ×1
BLADE SAG 18X100X1.27 (BLADE) ×1 IMPLANT
COVER BACK TABLE 60X90IN (DRAPES) ×1 IMPLANT
COVER SURGICAL LIGHT HANDLE (MISCELLANEOUS) ×1 IMPLANT
CUP HUMERAL 42 PLUS 3 (Orthopedic Implant) IMPLANT
DRAPE INCISE IOBAN 66X45 STRL (DRAPES) ×1 IMPLANT
DRAPE ORTHO SPLIT 77X108 STRL (DRAPES) ×2
DRAPE SHEET LG 3/4 BI-LAMINATE (DRAPES) ×1 IMPLANT
DRAPE SURG ORHT 6 SPLT 77X108 (DRAPES) ×2 IMPLANT
DRAPE TOP 10253 STERILE (DRAPES) ×1 IMPLANT
DRAPE U-SHAPE 47X51 STRL (DRAPES) ×1 IMPLANT
DRILL 2.5 (BIT) ×1
DRSG ADAPTIC 3X8 NADH LF (GAUZE/BANDAGES/DRESSINGS) ×1 IMPLANT
DRSG PAD ABDOMINAL 8X10 ST (GAUZE/BANDAGES/DRESSINGS) ×1 IMPLANT
DURAPREP 26ML APPLICATOR (WOUND CARE) ×1 IMPLANT
ELECT BLADE TIP CTD 4 INCH (ELECTRODE) ×1 IMPLANT
ELECT NDL TIP 2.8 STRL (NEEDLE) ×1 IMPLANT
ELECT NEEDLE TIP 2.8 STRL (NEEDLE) ×1 IMPLANT
ELECT REM PT RETURN 15FT ADLT (MISCELLANEOUS) ×1 IMPLANT
EPIPHSYSI CENTER SZ 2 LT (Shoulder) ×1 IMPLANT
EPIPHYSIS CENTER SZ 2 LT (Shoulder) IMPLANT
FACESHIELD WRAPAROUND (MASK) ×1 IMPLANT
FACESHIELD WRAPAROUND OR TEAM (MASK) ×1 IMPLANT
GAUZE PAD ABD 8X10 STRL (GAUZE/BANDAGES/DRESSINGS) IMPLANT
GAUZE SPONGE 4X4 12PLY STRL (GAUZE/BANDAGES/DRESSINGS) ×1 IMPLANT
GLENOSPHERE XTEND LAT 42+0 STD (Miscellaneous) IMPLANT
GLOVE BIOGEL PI IND STRL 7.5 (GLOVE) ×1 IMPLANT
GLOVE BIOGEL PI IND STRL 8.5 (GLOVE) ×1 IMPLANT
GLOVE BIOGEL PI INDICATOR 7.5 (GLOVE) ×1
GLOVE BIOGEL PI INDICATOR 8.5 (GLOVE) ×1
GLOVE ORTHO TXT STRL SZ7.5 (GLOVE) ×1 IMPLANT
GLOVE SURG ORTHO 8.5 STRL (GLOVE) ×1 IMPLANT
GOWN STRL REUS W/ TWL XL LVL3 (GOWN DISPOSABLE) ×2 IMPLANT
GOWN STRL REUS W/TWL XL LVL3 (GOWN DISPOSABLE) ×2
KIT BASIN OR (CUSTOM PROCEDURE TRAY) ×1 IMPLANT
KIT TURNOVER KIT A (KITS) IMPLANT
MANIFOLD NEPTUNE II (INSTRUMENTS) ×1 IMPLANT
METAGLENE DELTA EXTEND (Trauma) IMPLANT
METAGLENE DXTEND (Trauma) ×1 IMPLANT
NDL MAYO CATGUT SZ4 TPR NDL (NEEDLE) ×1 IMPLANT
NEEDLE MAYO CATGUT SZ4 (NEEDLE) ×1 IMPLANT
NS IRRIG 1000ML POUR BTL (IV SOLUTION) ×1 IMPLANT
PACK SHOULDER (CUSTOM PROCEDURE TRAY) ×1 IMPLANT
PIN GUIDE 1.2 (PIN) IMPLANT
PIN GUIDE GLENOPHERE 1.5MX300M (PIN) IMPLANT
PIN METAGLENE 2.5 (PIN) IMPLANT
PROTECTOR NERVE ULNAR (MISCELLANEOUS) ×1 IMPLANT
RESTRAINT HEAD UNIVERSAL NS (MISCELLANEOUS) ×1 IMPLANT
SCREW 4.5X18MM (Screw) ×1 IMPLANT
SCREW 48L (Screw) IMPLANT
SCREW BN 18X4.5XSTRL SHLDR (Screw) IMPLANT
SCREW LOCK 42 (Screw) IMPLANT
SLING ARM FOAM STRAP LRG (SOFTGOODS) IMPLANT
SLING ARM IMMOBILIZER LRG (SOFTGOODS) IMPLANT
SMARTMIX MINI TOWER (MISCELLANEOUS)
SPIKE FLUID TRANSFER (MISCELLANEOUS) ×1 IMPLANT
SPONGE T-LAP 4X18 ~~LOC~~+RFID (SPONGE) ×1 IMPLANT
STEM 12 HA (Stem) IMPLANT
STRIP CLOSURE SKIN 1/2X4 (GAUZE/BANDAGES/DRESSINGS) ×1 IMPLANT
SUCTION FRAZIER HANDLE 10FR (MISCELLANEOUS) ×1
SUCTION TUBE FRAZIER 10FR DISP (MISCELLANEOUS) ×1 IMPLANT
SUT FIBERWIRE #2 38 T-5 BLUE (SUTURE) ×2
SUT MNCRL AB 4-0 PS2 18 (SUTURE) ×1 IMPLANT
SUT VIC AB 0 CT1 36 (SUTURE) ×2 IMPLANT
SUT VIC AB 0 CT2 27 (SUTURE) ×1 IMPLANT
SUT VIC AB 2-0 CT1 27 (SUTURE) ×1
SUT VIC AB 2-0 CT1 TAPERPNT 27 (SUTURE) ×1 IMPLANT
SUTURE FIBERWR #2 38 T-5 BLUE (SUTURE) ×2 IMPLANT
TAPE CLOTH SURG 6X10 WHT LF (GAUZE/BANDAGES/DRESSINGS) IMPLANT
TAPE STRIPS DRAPE STRL (GAUZE/BANDAGES/DRESSINGS) IMPLANT
TOWEL OR 17X26 10 PK STRL BLUE (TOWEL DISPOSABLE) ×1 IMPLANT
TOWER SMARTMIX MINI (MISCELLANEOUS) IMPLANT

## 2021-10-28 NOTE — Progress Notes (Signed)
  Transition of Care [facility]) Screening Note   Patient Details  Name: Robert Petty Date of Birth: 23-Dec-1945   Transition of [facility]) CM/SW Contact:    Amada Jupiter, LCSW Phone Number: 10/28/2021, 1:51 PM    Transition of Care Department [facility]) has reviewed patient and no TOC needs have been identified at this time. We will continue to monitor patient advancement through interdisciplinary progression rounds. If new patient transition needs arise, please place a TOC consult.

## 2021-10-28 NOTE — Op Note (Signed)
Robert Petty, Robert Petty MEDICAL RECORD NO: [id number] ACCOUNT NO: [id number] DATE OF BIRTH: 11-Jan-1946 FACILITY: [facility] LOCATION: [facility] PHYSICIAN: Almedia Balls. Ranell Patrick, MD  Operative Report   DATE OF PROCEDURE: 10/28/2021  PREOPERATIVE DIAGNOSIS:  Right shoulder rotator cuff tear arthropathy.  POSTOPERATIVE DIAGNOSIS:  Right shoulder rotator cuff tear arthropathy.  PROCEDURE PERFORMED:  Right reverse shoulder replacement using DePuy Delta Xtend prosthesis with no subscap repair.  ATTENDING SURGEON:  Almedia Balls. Ranell Patrick, MD  ASSISTANT:  Modesto Charon, MD, PA-C, who was scrubbed during the entire procedure and necessary for satisfactory completion of surgery.  ANESTHESIA:  General anesthesia was used plus interscalene block.  ESTIMATED BLOOD LOSS:  150 mL  FLUID REPLACEMENT:  1500 mL crystalloid.  INSTRUMENT COUNTS:  Correct.  COMPLICATIONS:  No complications.  ANTIBIOTICS:  Perioperative antibiotics were given.  INDICATIONS:  The patient is a 76 year old male who presents with a history of worsening right shoulder pain and dysfunction secondary to end-stage rotator cuff tear arthropathy.  The patient has failed an extended period of conservative management,  presents with ongoing pain and functional limitations, desiring reverse shoulder replacement in order to eliminate pain and restore function and quality of life.  Informed consent obtained.  DESCRIPTION OF PROCEDURE:  After an adequate level of anesthesia was achieved, the patient was positioned in modified beach chair position.  Right shoulder correctly identified and sterilely prepped and draped in the usual manner.  Timeout called,  verifying correct patient, correct site, We entered the patient's shoulder using a standard deltopectoral approach, starting at the coracoid process extending down the anterior humerus using the Bovie.  We started with a 10 blade scalpel and subcutaneous  dissection with the Bovie, we identified  the cephalic vein and took that laterally with the deltoid pectoralis taken medially.  Conjoined tendon identified and retracted medially.  Deep retractors placed.  We tenodesed the biceps in situ with 0 Vicryl  figure-of-eight suture.  We then went ahead and released the subscapularis remnant off the lesser tuberosity.  The subscap looked to be in bad shape with a significant tendinopathic character and we just felt like this was not repairable, but we did tag  it for protection of the axillary nerve.  We did release the inferior capsule progressively, externally rotating and extending the shoulder and delivering the humeral head out of the wound.  The humeral head was completely devoid of cartilage.  We  entered the proximal humerus with a 6 mm reamer, reamed up to a size 12.  We then placed a 12 mm T-handle guide into the humerus.  We resected the head at 20 degrees of retroversion with the oscillating saw.  We then removed excess osteophytes with a  rongeur.  We then subluxed the humerus posteriorly, gaining good exposure of the glenoid face.  We removed the capsule and superior labrum.  We went ahead and had good exposure of the glenoid face.  At this point, we removed the remaining cartilage.  We  had our deep retractors placed.  We then found our center point for a guide pin, placed our guide pin.  Next, we went ahead and reamed for the metaglene baseplate.  At this point, we drilled our central peg hole and impacted HA-coated press-fit metaglene  into position and then placed a 48 screw inferiorly, a 42 screw superiorly and an 18 nonlocked screw posteriorly. We locked the superior and inferior screws. Baseplate security and support was excellent.  We then went ahead and did the 42  standard  glenosphere on top of the baseplate and secured that with a screwdriver.  I did a finger sweep to make sure we had no soft tissue caught up in the baseplate glenosphere bearing.  Next, we went back to the humeral  side and based on the patient's humeral  anatomy, the size 2 was the best fit and we went up using a left, not a right because of the patient having more of the proximal humerus anterior relative to the shaft, so we used a 2 left metaphysis for this right shoulder and trialed with that and we  had excellent fill and support for that stem and then we reduced with a 42+3 poly trial and reduced the shoulder and had excellent soft tissue balancing and stability.  We removed all trial components, irrigated thoroughly and used available bone graft  from the humeral head and then did a press-fit technique with the HA-coated 12 stem with the 2 left metaphysis, again, set on the 0 setting and impacted in 20 degrees of retroversion for this right shoulder.  Once we had our real stem in place, we  selected the real 42+3 poly and impacted on the humeral tray, reduced the shoulder and again we were happy with soft tissue balancing stability, excellent range of motion, no impingement.  We went ahead and removed the subscap remnant.  We irrigated  thoroughly and then closed the deltopectoral interval with 0 Vicryl suture followed by 2-0 Vicryl for subcutaneous closure and 4-0 Monocryl for skin.  Steri-Strips applied followed by sterile dressing.  The patient tolerated surgery well.         PAA D: 10/28/2021 8:45:05 am T: 10/28/2021 9:22:00 am  JOB: [id number]/ [id number]

## 2021-10-28 NOTE — Anesthesia Postprocedure Evaluation (Signed)
Anesthesia Post Note  Patient: Robert Petty  Procedure(s) Performed: REVERSE SHOULDER ARTHROPLASTY (Right: Shoulder)     Patient location during evaluation: PACU Anesthesia Type: Regional and General Level of consciousness: awake Pain management: pain level controlled Vital Signs Assessment: post-procedure vital signs reviewed and stable Respiratory status: spontaneous breathing, nonlabored ventilation, respiratory function stable and patient connected to nasal cannula oxygen Cardiovascular status: blood pressure returned to baseline and stable Postop Assessment: no apparent nausea or vomiting Anesthetic complications: no   No notable events documented.  Last Vitals:  Vitals:   10/28/21 1219 10/28/21 1327  BP: 118/69 115/66  Pulse: 68 80  Resp: 16 16  Temp: 36.7 C 36.4 C  SpO2: 97% 97%    Last Pain:  Vitals:   10/28/21 1327  TempSrc: Oral  PainSc:                  Jazen Spraggins P Ruthia Person

## 2021-10-28 NOTE — Anesthesia Procedure Notes (Signed)
Anesthesia Regional Block: Interscalene brachial plexus block   Pre-Anesthetic Checklist: , timeout performed,  Correct Patient, Correct Site, Correct Laterality,  Correct Procedure, Correct Position, site marked,  Risks and benefits discussed,  Surgical consent,  Pre-op evaluation,  At surgeon's request and post-op pain management  Laterality: Right  Prep: chloraprep       Needles:  Injection technique: Single-shot  Needle Type: Echogenic Stimulator Needle     Needle Length: 9cm  Needle Gauge: 21     Additional Needles:   Procedures:,,,, ultrasound used (permanent image in chart),,    Narrative:  Start time: 10/28/2021 7:00 AM End time: 10/28/2021 7:10 AM Injection made incrementally with aspirations every 5 mL.  Performed by: Personally  Anesthesiologist: Leonides Grills, MD  Additional Notes: Functioning IV was confirmed and monitors were applied.  A timeout was performed. Sterile prep, hand hygiene and sterile gloves were used. A 86mm 21ga Arrow echogenic stimulator needle was used. Negative aspiration and negative test dose prior to incremental administration of local anesthetic. The patient tolerated the procedure well.  Ultrasound guidance: relevent anatomy identified, needle position confirmed, local anesthetic spread visualized around nerve(s), vascular puncture avoided.  Image printed for medical record.

## 2021-10-28 NOTE — Anesthesia Procedure Notes (Signed)
Procedure Name: Intubation Date/Time: 10/28/2021 7:26 AM  Performed by: Peirce Deveney, Forest Gleason, CRNAPre-anesthesia Checklist: Patient identified, Emergency Drugs available, Suction available, Patient being monitored and Timeout performed Patient Re-evaluated:Patient Re-evaluated prior to induction Oxygen Delivery Method: Circle system utilized Preoxygenation: Pre-oxygenation with 100% oxygen Induction Type: IV induction Ventilation: Mask ventilation without difficulty Laryngoscope Size: Mac and 4 Grade View: Grade I Tube type: Oral Tube size: 7.5 mm Number of attempts: 1 Airway Equipment and Method: Stylet Placement Confirmation: ETT inserted through vocal cords under direct vision, positive ETCO2, CO2 detector and breath sounds checked- equal and bilateral Secured at: 23 cm Tube secured with: Tape Dental Injury: Teeth and Oropharynx as per pre-operative assessment

## 2021-10-28 NOTE — Interval H&P Note (Signed)
History and Physical Interval Note:  10/28/2021 7:26 AM  Robert Petty  has presented today for surgery, with the diagnosis of right shoulder rotator cuff tear arthropathy.  The various methods of treatment have been discussed with the patient and family. After consideration of risks, benefits and other options for treatment, the patient has consented to  Procedure(s): REVERSE SHOULDER ARTHROPLASTY (Right) as a surgical intervention.  The patient's history has been reviewed, patient examined, no change in status, stable for surgery.  I have reviewed the patient's chart and labs.  Questions were answered to the patient's satisfaction.     Verlee Rossetti

## 2021-10-28 NOTE — Brief Op Note (Signed)
10/28/2021  8:39 AM  PATIENT:  Robert Petty  76 y.o. male  PRE-OPERATIVE DIAGNOSIS:  right shoulder rotator cuff tear arthropathy  POST-OPERATIVE DIAGNOSIS:  right shoulder rotator cuff tear arthropathy  PROCEDURE:  Procedure(s): REVERSE SHOULDER ARTHROPLASTY (Right) DePuy Delta Xtend with NO subscap repair  SURGEON:  Surgeon(s) and Role:    Beverely Low, MD - Primary  PHYSICIAN ASSISTANT:   ASSISTANTS: Thea Gist, PA-C   ANESTHESIA:   regional and general  EBL:  100 mL   BLOOD ADMINISTERED:none  DRAINS: none   LOCAL MEDICATIONS USED:  MARCAINE     SPECIMEN:  No Specimen  DISPOSITION OF SPECIMEN:  N/A  COUNTS:  YES  TOURNIQUET:  * No tourniquets in log *  DICTATION: .Other Dictation: Dictation Number [id number]  PLAN OF CARE: Admit for overnight observation  PATIENT DISPOSITION:  PACU - hemodynamically stable.   Delay start of Pharmacological VTE agent (>24hrs) due to surgical blood loss or risk of bleeding: not applicable

## 2021-10-28 NOTE — Discharge Instructions (Signed)
Ice to the shoulder constantly.  Keep the incision covered and clean and dry for one week, then ok to get it wet in the shower. ° °Do exercise as instructed several times per day. ° °DO NOT reach behind your back or push up out of a chair with the operative arm. ° °Use a sling while you are up and around for comfort, may remove while seated.  Keep pillow propped behind the operative elbow. ° °Follow up with Dr Margene Cherian in two weeks in the office, call [phone] for appt °

## 2021-10-28 NOTE — Evaluation (Signed)
Occupational Therapy Evaluation Patient Details Name: Robert Petty MRN: [id number] DOB: 07-13-1945 Today's Date: 10/28/2021   History of Present Illness Pt is a 76 yo male admitted for R reverse shoulder arthroplasty.   Clinical Impression   Pt admitted with the above diagnosis and has the deficits listed below.  Will review exercises with pt and wife in the am before d/c if schedule allows. Pt has demonstrated knowledge of HEP and adl techniques and is safe from OT standpoint with these tasks.        Recommendations for follow up therapy are one component of a multi-disciplinary discharge planning process, led by the attending physician.  Recommendations may be updated based on patient status, additional functional criteria and insurance authorization.   Follow Up Recommendations  Follow physician's recommendations for discharge plan and follow up therapies    Assistance Recommended at Discharge PRN  Patient can return home with the following A little help with bathing/dressing/bathroom;Assistance with cooking/housework    Functional Status Assessment  Patient has had a recent decline in their functional status and demonstrates the ability to make significant improvements in function in a reasonable and predictable amount of time.  Equipment Recommendations  None recommended by OT    Recommendations for Other Services       Precautions / Restrictions Precautions Precautions: Shoulder Type of Shoulder Precautions: ROM in elbow, wrist, fingers only. Sling all times except bathing and exercises. NWB RUE Shoulder Interventions: Shoulder sling/immobilizer;Off for dressing/bathing/exercises Required Braces or Orthoses: Sling Restrictions Weight Bearing Restrictions: Yes RUE Weight Bearing: Non weight bearing      Mobility Bed Mobility Overal bed mobility: Needs Assistance Bed Mobility: Supine to Sit     Supine to sit: Min assist     General bed mobility comments: first  time out of bed after surgery. Educated on sleeping position in side and on back.    Transfers Overall transfer level: Needs assistance Equipment used: 1 person hand held assist Transfers: Sit to/from Stand, Bed to chair/wheelchair/BSC Sit to Stand: Supervision Stand pivot transfers: Supervision         General transfer comment: supervision due to recent surgery      Balance Overall balance assessment: No apparent balance deficits (not formally assessed)                                         ADL either performed or assessed with clinical judgement   ADL Overall ADL's : At baseline                                       General ADL Comments: Pt was doing all adls independently PTA not using RUE due to pain. Pt aware of all dressing techinques and adl techniques adn donned and doffed sling without assist.     Vision Baseline Vision/History: 1 Wears glasses Ability to See in Adequate Light: 0 Adequate Patient Visual Report: No change from baseline Vision Assessment?: No apparent visual deficits     Perception     Praxis      Pertinent Vitals/Pain Pain Assessment Pain Assessment: Faces Faces Pain Scale: Hurts a little bit Pain Location: R shoulder Pain Descriptors / Indicators: Aching, Sore Pain Intervention(s): Limited activity within patient's tolerance, Monitored during session, Repositioned     Hand Dominance Right  Extremity/Trunk Assessment Upper Extremity Assessment Upper Extremity Assessment: RUE deficits/detail RUE Deficits / Details: no ROM to shoulder due to surgery. Otherwise WFL RUE Sensation:  (nerve block) RUE Coordination: decreased gross motor   Lower Extremity Assessment Lower Extremity Assessment: Overall WFL for tasks assessed   Cervical / Trunk Assessment Cervical / Trunk Assessment: Normal   Communication Communication Communication: No difficulties   Cognition Arousal/Alertness:  Awake/alert Behavior During Therapy: WFL for tasks assessed/performed Overall Cognitive Status: Within Functional Limits for tasks assessed                                       General Comments  Pt educated on all exercises and adl techniques and demonstrated understanding.    Exercises Exercises: Shoulder Shoulder Exercises Elbow Flexion: AAROM, Right, 10 reps Elbow Extension: AAROM, 10 reps, Right Wrist Flexion: AROM, Right, 5 reps Wrist Extension: AROM, Right, 5 reps Digit Composite Flexion: AROM, Right, 5 reps Composite Extension: AROM, Right, 5 reps   Shoulder Instructions Shoulder Instructions Donning/doffing shirt without moving shoulder: Supervision/safety Method for sponge bathing under operated UE: Supervision/safety Donning/doffing sling/immobilizer: Supervision/safety Correct positioning of sling/immobilizer: Supervision/safety ROM for elbow, wrist and digits of operated UE: Supervision/safety Sling wearing schedule (on at all times/off for ADL's): Supervision/safety Proper positioning of operated UE when showering: Supervision/safety Dressing change: Supervision/safety Positioning of UE while sleeping: Supervision/safety    Home Living Family/patient expects to be discharged to:: Private residence Living Arrangements: Spouse/significant other Available Help at Discharge: Family;Available 24 hours/day Type of Home: House Home Access: Stairs to enter [employer] of Steps: 1   Home Layout: Able to live on main level with bedroom/bathroom;Two level     Bathroom Shower/Tub: IT trainer: Handicapped height     Home Equipment: None          Prior Functioning/Environment Prior Level of Function : Independent/Modified Independent             Mobility Comments: independent ADLs Comments: was doing most adls with L arm only due to pain but was independent        OT Problem List: Decreased  strength      OT Treatment/Interventions: Self-care/ADL training    OT Goals(Current goals can be found in the care plan section) Acute Rehab OT Goals Patient Stated Goal: to go home OT Goal Formulation: With patient Time For Goal Achievement: 11/04/21 Potential to Achieve Goals: Good ADL Goals Additional ADL Goal #1: Pt will show knowledge of RUE hand/wrist/elbow exercies and ability to donn and doff sling with wife in room without assist.  OT Frequency: Min 1X/week    Co-evaluation              AM-PAC OT "6 Clicks" Daily Activity     Outcome Measure Help from another person eating meals?: None Help from another person taking care of personal grooming?: None Help from another person toileting, which includes using toliet, bedpan, or urinal?: None Help from another person bathing (including washing, rinsing, drying)?: A Little Help from another person to put on and taking off regular upper body clothing?: A Little Help from another person to put on and taking off regular lower body clothing?: None 6 Click Score: 22   End of Session Equipment Utilized During Treatment: Oxygen Nurse Communication: Mobility status;Other (comment) (O2 sats right at 90%)  Activity Tolerance: Patient tolerated treatment well Patient left: in chair;with call bell/phone  within reach;with chair alarm set  OT Visit Diagnosis: Pain Pain - Right/Left: Right Pain - part of body: Shoulder                Time: 1324-1400 OT Time Calculation (min): 36 min Charges:  OT General Charges $OT Visit: 1 Visit OT Evaluation $OT Eval Low Complexity: 1 Low OT Treatments $Self Care/Home Management : 8-22 mins  Hope Budds 10/28/2021, 2:16 PM

## 2021-10-28 NOTE — Transfer of Care (Signed)
Immediate Anesthesia Transfer of Care Note  Patient: Robert Petty  Procedure(s) Performed: REVERSE SHOULDER ARTHROPLASTY (Right: Shoulder)  Patient Location: PACU  Anesthesia Type:General  Level of Consciousness: awake  Airway & Oxygen Therapy: Patient Spontanous Breathing  Post-op Assessment: Report given to RN  Post vital signs: stable  Last Vitals:  Vitals Value Taken Time  BP 133/77 10/28/21 0850  Temp    Pulse 65 10/28/21 0854  Resp 15 10/28/21 0854  SpO2 99 % 10/28/21 0854  Vitals shown include unvalidated device data.  Last Pain:  Vitals:   10/28/21 0537  TempSrc: Oral         Complications: No notable events documented.

## 2021-10-29 DIAGNOSIS — M75121 Complete rotator cuff tear or rupture of right shoulder, not specified as traumatic: Secondary | ICD-10-CM | POA: Diagnosis not present

## 2021-10-29 LAB — HEMOGLOBIN AND HEMATOCRIT, BLOOD
HCT: 34.9 % — ABNORMAL LOW (ref 39.0–52.0)
Hemoglobin: 11.6 g/dL — ABNORMAL LOW (ref 13.0–17.0)

## 2021-10-29 LAB — BASIC METABOLIC PANEL
Anion gap: 7 (ref 5–15)
BUN: 17 mg/dL (ref 8–23)
CO2: 25 mmol/L (ref 22–32)
Calcium: 8.5 mg/dL — ABNORMAL LOW (ref 8.9–10.3)
Chloride: 107 mmol/L (ref 98–111)
Creatinine, Ser: 0.83 mg/dL (ref 0.61–1.24)
GFR, Estimated: >60 mL/min (ref >60)
Glucose, Bld: 120 mg/dL — ABNORMAL HIGH (ref 70–99)
Potassium: 3.8 mmol/L (ref 3.5–5.1)
Sodium: 139 mmol/L (ref 135–145)

## 2021-10-29 NOTE — Progress Notes (Signed)
Subjective: 1 Day Post-Op Procedure(s) (LRB): REVERSE SHOULDER ARTHROPLASTY (Right) Patient seen in rounds for Dr. Devonne Doughty this morning Patient reports pain as mild.   Patient doing very well, no events overnight.   Worked with occupational therapy yesterday did well.  Objective: Vital signs in last 24 hours: Temp:  [97.4 F (36.3 C)-98.1 F (36.7 C)] 97.9 F (36.6 C) (08/26 0459) Pulse Rate:  [62-94] 78 (08/26 0459) Resp:  [16-18] 18 (08/26 0459) BP: (106-133)/(66-81) 106/66 (08/26 0459) SpO2:  [92 %-97 %] 93 % (08/26 0459)  Intake/Output from previous day: 08/25 0701 - 08/26 0700 In: 2084.4 [P.O.:960; I.V.:824.4; IV Piggyback:300] Out: 1875 [Urine:1775; Blood:100] Intake/Output this shift: Total I/O In: 375.9 [P.O.:240; I.V.:135.9] Out: -   Recent Labs    10/29/21 0346  HGB 11.6*   Recent Labs    10/29/21 0346  HCT 34.9*   Recent Labs    10/29/21 0346  NA 139  K 3.8  CL 107  CO2 25  BUN 17  CREATININE 0.83  GLUCOSE 120*  CALCIUM 8.5*   No results for input(s): "LABPT", "INR" in the last 72 hours.  Neurologically intact Neurovascular intact Sensation intact distally Intact pulses distally No cellulitis present Compartment soft Able to move elbow wrist and fingers without any pain.   Assessment/Plan: 1 Day Post-Op Procedure(s) (LRB): REVERSE SHOULDER ARTHROPLASTY (Right) Patient is doing great.  He is can work with occupational therapy 1 more time this morning and hopeful discharge this afternoon Discharge orders have been placed, medications have been sent to his pharmacy New bandage placed over top of the incision site He will keep this on until about Tuesday I gave him extra bandage to change into He will follow-up with Dr. Ranell Patrick in about 10 to 14 days    [facility] [phone] 10/29/2021, 8:35 AM

## 2021-10-29 NOTE — Progress Notes (Signed)
Occupational Therapy Treatment Patient Details Name: Robert Petty MRN: [id number] DOB: 1945-04-21 Today's Date: 10/29/2021   History of present illness Pt is a 76 yo male admitted for R reverse shoulder arthroplasty.   OT comments  Treatment focused on reiteration of shoulder precautions, UE positioning, donning/doffing sling, exercises, use of ice and compensatory strategies for dressing and bathing. Patient dressed during treatment. Verbalizes understanding of all education.  No further OT needs.    Recommendations for follow up therapy are one component of a multi-disciplinary discharge planning process, led by the attending physician.  Recommendations may be updated based on patient status, additional functional criteria and insurance authorization.    Follow Up Recommendations  Follow physician's recommendations for discharge plan and follow up therapies    Assistance Recommended at Discharge    Patient can return home with the following  A little help with bathing/dressing/bathroom;Assistance with cooking/housework   Equipment Recommendations  None recommended by OT    Recommendations for Other Services      Precautions / Restrictions Precautions Precautions: Shoulder Type of Shoulder Precautions: ROM in elbow, wrist, fingers only. Sling all times except bathing and exercises. NWB RUE Shoulder Interventions: Shoulder sling/immobilizer;Off for dressing/bathing/exercises Precaution Booklet Issued: Yes (comment) Required Braces or Orthoses: Sling Restrictions Weight Bearing Restrictions: Yes RUE Weight Bearing: Non weight bearing       Mobility Bed Mobility Overal bed mobility: Independent                  Transfers Overall transfer level: Independent                       Balance Overall balance assessment: No apparent balance deficits (not formally assessed)                                         ADL either performed or  assessed with clinical judgement   ADL                                         General ADL Comments: Demonstrated ability to don shirt without breaking shoulder precautions. verbalizes understanding for doffing and for bathing.    Extremity/Trunk Assessment              Vision Baseline Vision/History: 1 Wears glasses Patient Visual Report: No change from baseline     Perception     Praxis      Cognition Arousal/Alertness: Awake/alert Behavior During Therapy: WFL for tasks assessed/performed Overall Cognitive Status: Within Functional Limits for tasks assessed                                          Exercises      Shoulder Instructions Shoulder Instructions Donning/doffing shirt without moving shoulder: Independent Method for sponge bathing under operated UE: Independent Donning/doffing sling/immobilizer: Independent Correct positioning of sling/immobilizer: Independent ROM for elbow, wrist and digits of operated UE: Independent Sling wearing schedule (on at all times/off for ADL's): Independent Proper positioning of operated UE when showering: Independent Dressing change: Independent Positioning of UE while sleeping: Independent     General Comments      Pertinent Vitals/ Pain  Pain Assessment Pain Assessment: No/denies pain  Home Living                                          Prior Functioning/Environment              Frequency           Progress Toward Goals  OT Goals(current goals can now be found in the care plan section)  Progress towards OT goals: Goals met/education completed, patient discharged from [facility] All goals met and education completed, patient discharged from OT services    Co-evaluation                 AM-PAC OT "6 Clicks" Daily Activity     Outcome Measure   Help from another person eating meals?: None Help from another person taking care of  personal grooming?: None Help from another person toileting, which includes using toliet, bedpan, or urinal?: None Help from another person bathing (including washing, rinsing, drying)?: None Help from another person to put on and taking off regular upper body clothing?: None Help from another person to put on and taking off regular lower body clothing?: A Little 6 Click Score: 23    End of Session    OT Visit Diagnosis: Muscle weakness (generalized) (M62.81)   Activity Tolerance Patient tolerated treatment well   Patient Left in chair;with call bell/phone within reach   Nurse Communication  (OT education complete, does not need chair alarm)        Time: 1610-9604 OT Time Calculation (min): 26 min  Charges: OT General Charges $OT Visit: 1 Visit OT Treatments $Self Care/Home Management : 23-37 mins  Robert Petty, OTR/L [facility] Pager: [phone]   Robert Petty 10/29/2021, 10:00 AM

## 2021-10-29 NOTE — Plan of Care (Signed)
Pt stable at this time.

## 2021-10-29 NOTE — Progress Notes (Signed)
Pt stable at time of d/c instructions and education. Pt dressing clean, dry, and intact. Pt to follow up with md.

## 2021-10-29 NOTE — Plan of Care (Signed)
  Problem: Activity: Goal: Risk for activity intolerance will decrease Outcome: Progressing   Problem: Clinical Measurements: Goal: Respiratory complications will improve Outcome: Progressing   Problem: Pain Managment: Goal: General experience of comfort will improve Outcome: Progressing   Problem: Safety: Goal: Ability to remain free from injury will improve Outcome: Progressing   Problem: Skin Integrity: Goal: Risk for impaired skin integrity will decrease Outcome: Progressing

## 2021-10-31 ENCOUNTER — Encounter (HOSPITAL_COMMUNITY): Payer: Self-pay | Admitting: Orthopedic Surgery

## 2021-11-16 NOTE — Discharge Summary (Signed)
In most cases prophylactic antibiotics for Dental procdeures after total joint surgery are not necessary.  Exceptions are as follows:  1. History of prior total joint infection  2. Severely immunocompromised (Organ Transplant, cancer chemotherapy, Rheumatoid biologic meds such as Humera)  3. Poorly controlled diabetes (A1C &gt; 8.0, blood glucose over 200)  If you have one of these conditions, contact your surgeon for an antibiotic prescription, prior to your dental procedure. Orthopedic Discharge Summary        Physician Discharge Summary  Patient ID: Robert Petty MRN: [id number] DOB/AGE: 1945/10/08 76 y.o.  Admit date: 10/28/2021 Discharge date: 10/29/21  Procedures:  Procedure(s) (LRB): REVERSE SHOULDER ARTHROPLASTY (Right)  Attending Physician:  Dr. Malon Kindle  Admission Diagnoses:   right shoulder cuff arthropathy  Discharge Diagnoses:  right shoulder cuff arthropathy   Past Medical History:  Diagnosis Date   Arthritis    Coronary artery disease    Hyperlipidemia    Kidney stones     PCP: Gaspar Garbe, MD   Discharged Condition: good  Hospital Course:  Patient underwent the above stated procedure on 10/28/2021. Patient tolerated the procedure well and brought to the recovery room in good condition and subsequently to the floor. Patient had an uncomplicated hospital course and was stable for discharge.   Disposition: Discharge disposition: 01-Home or Self Care      with follow up in 2 weeks    Follow-up Information     Beverely Low, MD. Call in 2 week(s).   Specialty: Orthopedic Surgery Why: call [phone] for appt Contact information: [address] [place] [address] [phone]                 Dental Antibiotics:  In most cases prophylactic antibiotics for Dental procdeures after total joint surgery are not necessary.  Exceptions are as follows:  1. History of prior total joint  infection  2. Severely immunocompromised (Organ Transplant, cancer chemotherapy, Rheumatoid biologic meds such as Humera)  3. Poorly controlled diabetes (A1C &gt; 8.0, blood glucose over 200)  If you have one of these conditions, contact your surgeon for an antibiotic prescription, prior to your dental procedure.  Discharge Instructions     Call MD / Call 911   Complete by: As directed    If you experience chest pain or shortness of breath, CALL 911 and be transported to the hospital emergency room.  If you develope a fever above 101 F, pus (white drainage) or increased drainage or redness at the wound, or calf pain, call your surgeon's office.   Constipation Prevention   Complete by: As directed    Drink plenty of fluids.  Prune juice may be helpful.  You may use a stool softener, such as Colace (over the counter) 100 mg twice a day.  Use MiraLax (over the counter) for constipation as needed.   Diet - low sodium heart healthy   Complete by: As directed    Discharge instructions   Complete by: As directed    Follow up in the office in 2 weeks   Increase activity slowly as tolerated   Complete by: As directed    Post-operative opioid taper instructions:   Complete by: As directed    POST-OPERATIVE OPIOID TAPER INSTRUCTIONS: It is important to wean off of your opioid medication as soon as possible. If you do not need pain medication after your surgery it is ok to stop day one. Opioids include: Codeine, Hydrocodone(Norco, Vicodin), Oxycodone(Percocet, oxycontin) and  hydromorphone amongst others.  Long term and even short term use of opiods can cause: Increased pain response Dependence Constipation Depression Respiratory depression And more.  Withdrawal symptoms can include Flu like symptoms Nausea, vomiting And more Techniques to manage these symptoms Hydrate well Eat regular healthy meals Stay active Use relaxation techniques(deep breathing, meditating, yoga) Do Not  substitute Alcohol to help with tapering If you have been on opioids for less than two weeks and do not have pain than it is ok to stop all together.  Plan to wean off of opioids This plan should start within one week post op of your joint replacement. Maintain the same interval or time between taking each dose and first decrease the dose.  Cut the total daily intake of opioids by one tablet each day Next start to increase the time between doses. The last dose that should be eliminated is the evening dose.          Allergies as of 10/29/2021   No Known Allergies      Medication List     STOP taking these medications    aspirin EC 81 MG tablet   Fluad Quadrivalent 0.5 ML injection Generic drug: influenza vaccine adjuvanted   oxyCODONE-acetaminophen 5-325 MG tablet Commonly known as: PERCOCET/ROXICET   tiZANidine 2 MG tablet Commonly known as: ZANAFLEX       TAKE these medications    atorvastatin 40 MG tablet Commonly known as: LIPITOR Take 1 tablet (40 mg total) by mouth daily. What changed: when to take this   multivitamin with minerals Tabs tablet Take 1 tablet by mouth daily.   Pfizer COVID-19 Vac Bivalent injection Generic drug: COVID-19 mRNA bivalent vaccine Proofreader) Inject into the muscle.   Pfizer-BioNT COVID-19 Vac-TriS Susp injection Generic drug: COVID-19 mRNA Vac-TriS (Pfizer) Inject into the muscle.   tamsulosin 0.4 MG Caps capsule Commonly known as: FLOMAX Take 0.4 mg by mouth daily.   traMADol 50 MG tablet Commonly known as: Ultram Take 1 tablet (50 mg total) by mouth every 6 (six) hours as needed for moderate pain or severe pain.          Signed: Thea Gist 11/16/2021, 1:05 PM  [facility] is now [employer] [address], [address], [place], [place] [address] Phone: [phone] Facebook  Instagram  [employer]

## 2021-11-25 ENCOUNTER — Other Ambulatory Visit (HOSPITAL_BASED_OUTPATIENT_CLINIC_OR_DEPARTMENT_OTHER): Payer: Self-pay

## 2021-11-25 MED ORDER — INFLUENZA VAC A&B SA ADJ QUAD 0.5 ML IM PRSY
PREFILLED_SYRINGE | INTRAMUSCULAR | 0 refills | Status: AC
  Filled 2021-11-25: qty 0.5, 1d supply, fill #0

## 2021-12-26 ENCOUNTER — Other Ambulatory Visit (HOSPITAL_BASED_OUTPATIENT_CLINIC_OR_DEPARTMENT_OTHER): Payer: Self-pay

## 2021-12-26 MED ORDER — COVID-19 MRNA VAC-TRIS(PFIZER) 30 MCG/0.3ML IM SUSY
PREFILLED_SYRINGE | INTRAMUSCULAR | 0 refills | Status: AC
  Filled 2021-12-26: qty 0.3, 1d supply, fill #0

## 2022-01-11 ENCOUNTER — Other Ambulatory Visit (HOSPITAL_BASED_OUTPATIENT_CLINIC_OR_DEPARTMENT_OTHER): Payer: Self-pay

## 2022-01-11 MED ORDER — RSVPREF3 VAC RECOMB ADJUVANTED 120 MCG/0.5ML IM SUSR
INTRAMUSCULAR | 0 refills | Status: AC
  Filled 2022-01-11: qty 0.5, 1d supply, fill #0

## 2022-11-07 ENCOUNTER — Other Ambulatory Visit (HOSPITAL_BASED_OUTPATIENT_CLINIC_OR_DEPARTMENT_OTHER): Payer: Self-pay

## 2022-11-07 MED ORDER — FLUAD 0.5 ML IM SUSY
0.5000 mL | PREFILLED_SYRINGE | Freq: Once | INTRAMUSCULAR | 0 refills | Status: AC
Start: 2022-10-31 — End: 2022-11-08
  Filled 2022-11-07: qty 0.5, 1d supply, fill #0

## 2023-11-26 ENCOUNTER — Other Ambulatory Visit (HOSPITAL_BASED_OUTPATIENT_CLINIC_OR_DEPARTMENT_OTHER): Payer: Self-pay

## 2023-11-26 MED ORDER — FLUZONE HIGH-DOSE 0.5 ML IM SUSY
0.5000 mL | PREFILLED_SYRINGE | Freq: Once | INTRAMUSCULAR | 0 refills | Status: AC
  Filled 2023-11-26: qty 0.5, 1d supply, fill #0

## 2023-11-26 MED ORDER — COMIRNATY 30 MCG/0.3ML IM SUSY
0.3000 mL | PREFILLED_SYRINGE | Freq: Once | INTRAMUSCULAR | 0 refills | Status: AC
  Filled 2023-11-26: qty 0.3, 1d supply, fill #0
